# Patient Record
Sex: Male | Born: 1950 | Race: White | Hispanic: No | Smoking: Former smoker
Health system: Southern US, Community
[De-identification: ages and names within clinical notes are randomized; demographics above are authoritative.]

## PROBLEM LIST (undated history)

## (undated) DIAGNOSIS — N200 Calculus of kidney: Secondary | ICD-10-CM

## (undated) DIAGNOSIS — I10 Essential (primary) hypertension: Secondary | ICD-10-CM

## (undated) DIAGNOSIS — H269 Unspecified cataract: Secondary | ICD-10-CM

## (undated) DIAGNOSIS — E785 Hyperlipidemia, unspecified: Secondary | ICD-10-CM

## (undated) DIAGNOSIS — Z9889 Other specified postprocedural states: Secondary | ICD-10-CM

## (undated) DIAGNOSIS — IMO0002 Reserved for concepts with insufficient information to code with codable children: Secondary | ICD-10-CM

## (undated) DIAGNOSIS — I2510 Atherosclerotic heart disease of native coronary artery without angina pectoris: Secondary | ICD-10-CM

## (undated) HISTORY — PX: WISDOM TOOTH EXTRACTION: SHX21

## (undated) HISTORY — DX: Unspecified cataract: H26.9

## (undated) HISTORY — DX: Essential (primary) hypertension: I10

## (undated) HISTORY — DX: Atherosclerotic heart disease of native coronary artery without angina pectoris: I25.10

## (undated) HISTORY — PX: OTHER SURGICAL HISTORY: SHX169

## (undated) HISTORY — DX: Hyperlipidemia, unspecified: E78.5

## (undated) HISTORY — DX: Calculus of kidney: N20.0

## (undated) HISTORY — DX: Other specified postprocedural states: Z98.890

## (undated) HISTORY — DX: Reserved for concepts with insufficient information to code with codable children: IMO0002

## (undated) HISTORY — PX: EYE SURGERY: SHX253

---

## 1975-10-24 HISTORY — PX: VASECTOMY: SHX75

## 2000-10-23 DIAGNOSIS — Z9889 Other specified postprocedural states: Secondary | ICD-10-CM

## 2000-10-23 HISTORY — DX: Other specified postprocedural states: Z98.890

## 2000-10-31 ENCOUNTER — Ambulatory Visit (HOSPITAL_COMMUNITY): Admission: RE | Admit: 2000-10-31 | Discharge: 2000-10-31 | Payer: Self-pay | Admitting: *Deleted

## 2000-11-06 ENCOUNTER — Ambulatory Visit (HOSPITAL_COMMUNITY): Admission: RE | Admit: 2000-11-06 | Discharge: 2000-11-06 | Payer: Self-pay | Admitting: *Deleted

## 2001-09-04 ENCOUNTER — Ambulatory Visit (HOSPITAL_BASED_OUTPATIENT_CLINIC_OR_DEPARTMENT_OTHER): Admission: RE | Admit: 2001-09-04 | Discharge: 2001-09-04 | Payer: Self-pay | Admitting: Otolaryngology

## 2007-11-01 ENCOUNTER — Ambulatory Visit (HOSPITAL_COMMUNITY): Admission: RE | Admit: 2007-11-01 | Discharge: 2007-11-01 | Payer: Self-pay | Admitting: Neurosurgery

## 2007-11-24 HISTORY — PX: SPINE SURGERY: SHX786

## 2009-11-08 ENCOUNTER — Encounter: Payer: Self-pay | Admitting: Internal Medicine

## 2009-11-16 ENCOUNTER — Encounter (INDEPENDENT_AMBULATORY_CARE_PROVIDER_SITE_OTHER): Payer: Self-pay | Admitting: *Deleted

## 2009-12-08 ENCOUNTER — Encounter (INDEPENDENT_AMBULATORY_CARE_PROVIDER_SITE_OTHER): Payer: Self-pay | Admitting: *Deleted

## 2009-12-10 ENCOUNTER — Ambulatory Visit: Payer: Self-pay | Admitting: Internal Medicine

## 2009-12-22 ENCOUNTER — Telehealth (INDEPENDENT_AMBULATORY_CARE_PROVIDER_SITE_OTHER): Payer: Self-pay | Admitting: *Deleted

## 2009-12-24 ENCOUNTER — Ambulatory Visit: Payer: Self-pay | Admitting: Internal Medicine

## 2009-12-30 ENCOUNTER — Encounter: Payer: Self-pay | Admitting: Internal Medicine

## 2010-11-24 NOTE — Procedures (Signed)
Summary: Colonoscopy  Patient: Ernest [name] Note: All result statuses are Final unless otherwise noted.  Tests: (1) Colonoscopy (COL)   COL Colonoscopy           DONE (C)     [facility]     [address]     [place], [place]  [address]           COLONOSCOPY PROCEDURE REPORT           PATIENT:  [name], [name]  MR#:  [id number]     BIRTHDATE:  03/09/1951, 58 yrs. old  GENDER:  male           ENDOSCOPIST:  Iva Boop, MD, [facility]     Referred by:  Robert Bellow, M.D.           PROCEDURE DATE:  12/24/2009     PROCEDURE:  Colonoscopy with biopsy and snare polypectomy     ASA CLASS:  Class I     INDICATIONS:  Routine Risk Screening           MEDICATIONS:   Fentanyl 50 mcg IV, Versed 6 mg IV           DESCRIPTION OF PROCEDURE:   After the risks benefits and     alternatives of the procedure were thoroughly explained, informed     consent was obtained.  Digital rectal exam was performed and     revealed no abnormalities and normal prostate.   The LB CF-H180AL     [id number] endoscope was introduced through the anus and advanced to     the terminal ileum which was intubated for a short distance,     without limitations.  The quality of the prep was excellent, using     MoviPrep.  The instrument was then slowly withdrawn as the colon     was fully examined.     Insertion: 2:16 minutes Withdrawal; 11:14 minutes     <<PROCEDUREIMAGES>>           FINDINGS:  Three polyps were found in the right colon. They were     diminutive. Two in ascending and one in transverse colon. One     polyp was removed using cold biopsy forceps. Two polyps were     snared without cautery. Retrieval was successful. This was     otherwise a normal examination of the colon.   Retroflexed views     in the rectum revealed internal hemorrhoids.    The scope was then     withdrawn from the patient and the procedure completed.           COMPLICATIONS:  None           ENDOSCOPIC IMPRESSION:     1)  Three diminutive  polyps removed from the right colon     2) Internal hemorrhoids     3) Normal colonoscopy otherwise, excellent prep           ADDENDUM: Mild Sigmoid Diverticulosis           REPEAT EXAM:  In for Colonoscopy, pending biopsy results.           Iva Boop, MD, [facility]           CC:  Robert Bellow, MD     The Patient           n.     REVISED:  12/24/2009 11:34 AM     eSIGNED:   Iva Boop at  12/24/2009 11:34 AM           Berneta Sages, [id number]  Note: An exclamation mark (!) indicates a result that was not dispersed into the flowsheet. Document Creation Date: 12/24/2009 11:34 AM _______________________________________________________________________  (1) Order result status: Final Collection or observation date-time: 12/24/2009 11:11 Requested date-time:  Receipt date-time:  Reported date-time:  Referring Physician:   Ordering Physician: Stan Head [phone]) Specimen Source:  Source: Launa Grill Order Number: [id number] Lab site:   Appended Document: Colonoscopy     Procedures Next Due Date:    Colonoscopy: 12/2014

## 2010-11-24 NOTE — Miscellaneous (Signed)
Summary: [facility] Previsit/prep  Clinical Lists Changes  Medications: Added new medication of MOVIPREP 100 GM  SOLR (PEG-KCL-NACL-NASULF-NA ASC-C) As per prep instructions. - Signed Rx of MOVIPREP 100 GM  SOLR (PEG-KCL-NACL-NASULF-NA ASC-C) As per prep instructions.;  #1 x 0;  Signed;  Entered by: Wyona Almas RN;  Authorized by: Iva Boop MD, [facility];  Method used: [facility] to [facility] [phone]*, [address] [place], [place], [place]  [address], Ph: [phone], Fax: [phone] Observations: Added new observation of NKA: T (12/10/2009 8:09)    Prescriptions: MOVIPREP 100 GM  SOLR (PEG-KCL-NACL-NASULF-NA ASC-C) As per prep instructions.  #1 x 0   Entered by:   Wyona Almas RN   Authorized by:   Iva Boop MD, [facility]   Signed by:   Wyona Almas RN on 12/10/2009   Method used:   Electronically to        [facility] [phone]* (retail)       [address]       [place], [place]  [address]       Ph: [phone]       Fax: [phone]   RxID:   [id number]

## 2010-11-24 NOTE — Letter (Signed)
Summary: [name] Instructions  Ernest [name]  [address] [place], [place] [address]   Phone: [phone]  Fax: [phone]       Ernest [name]    12/18/1950    MRN: [id number]        Procedure Day Ernest [name]:  Ernest [name]  12/24/09     Arrival Time:  9:30AM     Procedure Time:  10:30AM     Location of Procedure:                    _X _   Endoscopy Center (4th Floor)                        PREPARATION FOR COLONOSCOPY WITH MOVIPREP   Starting 5 days prior to your procedure 12/19/09 do not eat nuts, seeds, popcorn, corn, beans, peas,  salads, or any raw vegetables.  Do not take any fiber supplements (e.g. Metamucil, Citrucel, and Benefiber).  THE DAY BEFORE YOUR PROCEDURE         DATE: 12/23/09  DAY: THURSDAY  1.  Drink clear liquids the entire day-NO SOLID FOOD  2.  Do not drink anything colored red or purple.  Avoid juices with pulp.  No orange juice.  3.  Drink at least 64 oz. (8 glasses) of fluid/clear liquids during the day to prevent dehydration and help the prep work efficiently.  CLEAR LIQUIDS INCLUDE: Water Jello Ice Popsicles Tea (sugar ok, no milk/cream) Powdered fruit flavored drinks Coffee (sugar ok, no milk/cream) Gatorade Juice: apple, white grape, white cranberry  Lemonade Clear bullion, consomm, broth Carbonated beverages (any kind) Strained chicken noodle soup Hard Candy                             4.  In the morning, mix first dose of MoviPrep solution:    Empty 1 Pouch A and 1 Pouch B into the disposable container    Add lukewarm drinking water to the top line of the container. Mix to dissolve    Refrigerate (mixed solution should be used within 24 hrs)  5.  Begin drinking the prep at 5:00 p.m. The MoviPrep container is divided by 4 marks.   Every 15 minutes drink the solution down to the next mark (approximately 8 oz) until the full liter is complete.   6.  Follow completed prep with 16 oz of clear liquid of your choice  (Nothing red or purple).  Continue to drink clear liquids until bedtime.  7.  Before going to bed, mix second dose of MoviPrep solution:    Empty 1 Pouch A and 1 Pouch B into the disposable container    Add lukewarm drinking water to the top line of the container. Mix to dissolve    Refrigerate  THE DAY OF YOUR PROCEDURE      DATE: 12/24/09  DAY: FRIDAY  Beginning at 5:30AM (5 hours before procedure):         1. Every 15 minutes, drink the solution down to the next mark (approx 8 oz) until the full liter is complete.  2. Follow completed prep with 16 oz. of clear liquid of your choice.    3. You may drink clear liquids until 8:30AM (2 HOURS BEFORE PROCEDURE).   MEDICATION INSTRUCTIONS  Unless otherwise instructed, you should take regular prescription medications with a small sip of water   as early as possible the morning  of your procedure.        OTHER INSTRUCTIONS  You will need a responsible adult at least 60 years of age to accompany you and drive you home.   This person must remain in the waiting room during your procedure.  Wear loose fitting clothing that is easily removed.  Leave jewelry and other valuables at home.  However, you may wish to bring a book to read or  an iPod/MP3 player to listen to music as you wait for your procedure to start.  Remove all body piercing jewelry and leave at home.  Total time from sign-in until discharge is approximately 2-3 hours.  You should go home directly after your procedure and rest.  You can resume normal activities the  day after your procedure.  The day of your procedure you should not:   Drive   Make legal decisions   Operate machinery   Drink alcohol   Return to work  You will receive specific instructions about eating, activities and medications before you leave.    The above instructions have been reviewed and explained to me by   Ernest Almas RN  December 10, 2009 8:36 AM     I fully understand  and can verbalize these instructions _____________________________ Date _________

## 2010-11-24 NOTE — Letter (Signed)
Summary: Previsit letter  [facility]  [address] [place], [place] [address]   Phone: [phone]  Fax: [phone]       11/16/2009 MRN: [id number]  Ernest [name] [address] [place], [place]  [address]  Dear Mr. Flinn,  Welcome to the Gastroenterology Division at [facility].    You are scheduled to see a nurse for your pre-procedure visit on 12-10-09 at 8:30a.m. on the 3rd floor at [facility], [address]  We ask that you try to arrive at our office 15 minutes prior to your appointment time to allow for check-in.  Your nurse visit will consist of discussing your medical and surgical history, your immediate family medical history, and your medications.    Please bring a complete list of all your medications or, if you prefer, bring the medication bottles and we will list them.  We will need to be aware of both prescribed and over the counter drugs.  We will need to know exact dosage information as well.  If you are on blood thinners (Coumadin, Plavix, Aggrenox, Ticlid, etc.) please call our office today/prior to your appointment, as we need to consult with your physician about holding your medication.   Please be prepared to read and sign documents such as consent forms, a financial agreement, and acknowledgement forms.  If necessary, and with your consent, a friend or relative is welcome to sit-in on the nurse visit with you.  Please bring your insurance card so that we may make a copy of it.  If your insurance requires a referral to see a specialist, please bring your referral form from your primary care physician.  No co-pay is required for this nurse visit.     If you cannot keep your appointment, please call [phone] to cancel or reschedule prior to your appointment date.  This allows [place] the opportunity to schedule an appointment for another patient in need of care.    Thank you for choosing [facility] for your medical needs.   We appreciate the opportunity to care for you.  Please visit [place] at our website  to learn more about our practice.                     Sincerely.                                                                                                                   The Gastroenterology Division

## 2010-11-24 NOTE — Letter (Signed)
Summary: [facility]  [facility]   Imported By: Sherian Rein 01/18/2010 09:08:23  _____________________________________________________________________  External Attachment:    Type:   Image     Comment:   External Document

## 2010-11-24 NOTE — Progress Notes (Signed)
Summary: prep ?  Phone Note Call from Patient Call back at Home Phone [phone]   Caller: Patient Call For: Dr. Leone Payor Reason for Call: Talk to Nurse Summary of Call: pt has COL on Friday... he ate a salad, raw vegetables, and nuts Monday and a salad Tuesday (yesterday) Initial call taken by: Vallarie Mare,  December 22, 2009 1:47 PM  Follow-up for Phone Call        Told pt he did not need to rescedule procedure.  Reviewed food restrictions with pat and told him to stay within those guidelines until he had his procedure. Follow-up by: Ezra Sites RN,  December 22, 2009 1:54 PM

## 2010-11-24 NOTE — Letter (Signed)
Summary: Patient Notice- Polyp Results  [facility]  [address] [place], [place] [address]   Phone: [phone]  Fax: [phone]        December 30, 2009 MRN: [id number]    Ernest [name] [address] [place], [place]  [address]    Dear Mr. Sforza,  The polyps removed from your colon were adenomatous. This means that they were pre-cancerous or that  they had the potential to change into cancer over time.  I recommend that you have a repeat colonoscopy in 5 years to determine if you have developed any new polyps over time. If you develop any new rectal bleeding, abdominal pain or significant bowel habit changes, please contact us before then.  Please call us if you are having persistent problems or have questions about your condition that have not been fully answered at this time.   Sincerely,  Iva Boop MD, [facility]  This letter has been electronically signed by your physician.  Appended Document: Patient Notice- Polyp Results Letter mailed 3.10.11

## 2011-03-07 NOTE — Op Note (Signed)
Ernest [name], Ernest [name]              ACCOUNT NO.:  [id number]   MEDICAL RECORD NO.:  [id number]          PATIENT TYPE:  AMB   LOCATION:  SDS                          FACILITY:  [facility]   PHYSICIAN:  Coletta Memos, M.D.     DATE OF BIRTH:  1951-03-11   DATE OF PROCEDURE:  11/01/2007  DATE OF DISCHARGE:                               OPERATIVE REPORT   PREOPERATIVE DIAGNOSES:  1. Displaced disk right L5-S1.  2. Right S1 radiculopathy.   POSTOPERATIVE DIAGNOSES:  1. Displaced disk right L5-S1.  2. Right S1 radiculopathy.   PROCEDURE:  Right L5-S1 diskectomy with microdissection.   COMPLICATIONS:  None.   SURGEON:  Coletta Memos, M.D.   ASSISTANT:  Hewitt Shorts, M.D.   INDICATIONS FOR PROCEDURE:  Kohlton Gilpatrick presented with severe pain in  his right lower extremity.  MRI showed a very large herniated disk at L5-  S1 eccentric to the right side.  I offered operative decompression and  he has agreed.   OPERATIVE NOTE:  Mr. Gershman was brought to the operating room intubated  and placed under a general anesthetic without difficulty.  He was rolled  prone onto a Wilson frame and all pressure points were properly padded.  His back was prepped and then he was draped in a sterile fashion.  I  infiltrated 10 mL 0.5% lidocaine with 1:200,000 strength epinephrine  into the lumbar region.  I opened the skin with a #10 blade and took  this down to the thoracolumbar fascia sharply.  I then exposed the  lamina of L5 and S1 using a #10 blade and Cobb curettes.  After  reflecting the paraspinous musculature in a subperiosteal fashion I  placed a double ended ganglion knife inferior to the superior lamina  exposing my wound.  X-ray showed this was at the L5-S1 disk space  interlaminar space.   I then opened the ligamentum flavum using a #10 blade and a hook.  I  removed it with a Kerrison punch and expose the thecal sac and S1 nerve  root on the right side.  I retracted that medially and  encountered what  was a large disk herniation.  I used a 15 blade to cut a window like  opening, rectangular opening into the disk space through the annulus at  L5-S1.  With microdissection and Dr. Earl Gala assistance we then  removed the disk in a piecemeal fashion.  It was quite degenerated and a  good portion of it did come out.  I then irrigated the wound.  I felt  that there  was adequate decompression of the S1 nerve root and the thecal sac at  that level.  I then irrigated the wound.  I then closed the wound in a  layered fashion using Vicryl sutures.  Dermabond was used for a sterile  dressing.  Mr. Kadrmas tolerated the procedure well and was moving all  extremities postoperatively.           ______________________________  Coletta Memos, M.D.     KC/MEDQ  D:  11/01/2007  T:  11/01/2007  Job:  [id number]

## 2011-03-10 NOTE — Procedures (Signed)
[facility]  Patient:    Ernest [name], Ernest [name]                     MRN: [id number] Proc. Date: 11/06/00 Adm. Date:  [id number] Attending:  Sabino Gasser CC:         Dr. Robert Bellow, [facility]                           Procedure Report  PROCEDURE:  Colonoscopy.  INDICATIONS:  Colon cancer screening.  ANESTHESIA:  Demerol 75 mg, Versed 8 mg.  DESCRIPTION OF PROCEDURE:  With patient mildly sedated in the left lateral decubitus position, the Olympus videoscopic colonoscope was inserted into the rectum after normal rectal examination was performed and passed under direct vision to the cecum.  Cecum identified by the ileocecal valve and appendiceal orifice, both of which were photographed.  From this point, the colonoscope was slowly withdrawn, taking circumferential views of the entire colonic mucosa, stopping only in the rectum, which appeared normal on direct and retroflex view.  The endoscope was straightened and withdrawn.  The patients vital signs and pulse oximetry remained stable.  The patient tolerated the procedure well without apparent complications.  FINDINGS:  Unremarkable colonoscopic examination.  PLAN:  Repeat examination possibly in 10 years. DD:  11/06/00 TD:  11/06/00 Job: [id number]

## 2011-03-10 NOTE — Cardiovascular Report (Signed)
[place]  Patient:    Ernest [name], Ernest [name]                    MRN: [id number] Proc. Date: 10/31/00 Attending:  Daisey Must, M.D. [facility] CC:         Robert Bellow, M.D.             Cardiac Catheterization Lab                        Cardiac Catheterization  PROCEDURE PERFORMED:  Left heart catheterization with coronary angiography and left ventriculography.  INDICATIONS:  Mr. Klabunde is a 60 year old male with multiple cardiovascular risk factors.  He has been having atypical chest pain.  A stress Cardiolite done in the office was notable for borderline mild ischemia in the inferior wall.  We therefore opted to proceed with cardiac catheterization.  PROCEDURAL NOTE:  A 6-French sheath was placed in the right femoral artery. Standard Judkins 6-French catheters were utilized.  Contrast was Omnipaque. There were no complications.  RESULTS:  Hemodynamics:  Left ventricular pressure 120/16.  Aortic pressure 120/75. There is no aortic valve gradient.  Left ventriculogram:  Wall motion is normal.  Ejection fraction calculated at 63%.  No mitral regurgitation.  Coronary arteriography (left dominant): 1. The left main is normal. 2. The left anterior descending artery has a 20% stenosis at its origin and a    minor luminal irregularity in the mid vessel.  The LAD gives rise to 2    small diagonal branches. 3. The left circumflex is a large dominant vessel.  There is a 50% stenosis in    the ostium of the circumflex and a 50% stenosis in the distal circumflex    at the origin of the posterior descending artery.  The circumflex gives    rise to a large ramus intermedius which has a 30% stenosis at its origin.    The circumflex then gives rise to a small OM-1, small OM-2, a small to    normal-sized posterolateral branch, and a normal-sized posterior descending    artery. 4. The right coronary artery is a small, nondominant vessel and free of  angiographic disease.  IMPRESSIONS: 1. Normal left ventricular systolic function. 2. Moderate, but nonobstructive, disease involving the left circumflex as    described.  PLAN:  Medical therapy.DD:  10/31/00 TD:  10/31/00 Job: [id number]

## 2011-07-13 LAB — CBC
HCT: 41.8
Hemoglobin: 14.5
RDW: 12.9

## 2011-10-04 ENCOUNTER — Ambulatory Visit (INDEPENDENT_AMBULATORY_CARE_PROVIDER_SITE_OTHER): Payer: BC Managed Care – PPO

## 2011-10-04 DIAGNOSIS — J111 Influenza due to unidentified influenza virus with other respiratory manifestations: Secondary | ICD-10-CM

## 2012-03-12 ENCOUNTER — Ambulatory Visit (INDEPENDENT_AMBULATORY_CARE_PROVIDER_SITE_OTHER): Payer: Managed Care, Other (non HMO) | Admitting: Family Medicine

## 2012-03-12 DIAGNOSIS — L509 Urticaria, unspecified: Secondary | ICD-10-CM

## 2012-03-12 LAB — POCT CBC
Granulocyte percent: 59.6 %G (ref 37–80)
HCT, POC: 42.2 % — AB (ref 43.5–53.7)
Hemoglobin: 13.9 g/dL — AB (ref 14.1–18.1)
Lymph, poc: 1.7 (ref 0.6–3.4)
MCH, POC: 32.8 pg — AB (ref 27–31.2)
MCHC: 32.9 g/dL (ref 31.8–35.4)
MCV: 99.6 fL — AB (ref 80–97)
MID (cbc): 0.4 (ref 0–0.9)
MPV: 9.9 fL (ref 0–99.8)
POC Granulocyte: 3.1 (ref 2–6.9)
POC LYMPH PERCENT: 33.6 %L (ref 10–50)
POC MID %: 6.8 %M (ref 0–12)
Platelet Count, POC: 251 10*3/uL (ref 142–424)
RBC: 4.24 M/uL — AB (ref 4.69–6.13)
RDW, POC: 13.2 %
WBC: 5.2 10*3/uL (ref 4.6–10.2)

## 2012-03-12 LAB — COMPREHENSIVE METABOLIC PANEL
ALT: 20 U/L (ref 0–53)
AST: 21 U/L (ref 0–37)
Albumin: 4.6 g/dL (ref 3.5–5.2)
Alkaline Phosphatase: 58 U/L (ref 39–117)
BUN: 22 mg/dL (ref 6–23)
CO2: 26 mEq/L (ref 19–32)
Calcium: 9.3 mg/dL (ref 8.4–10.5)
Chloride: 105 mEq/L (ref 96–112)
Creat: 0.91 mg/dL (ref 0.50–1.35)
Glucose, Bld: 99 mg/dL (ref 70–99)
Potassium: 4.4 mEq/L (ref 3.5–5.3)
Sodium: 140 mEq/L (ref 135–145)
Total Bilirubin: 0.6 mg/dL (ref 0.3–1.2)
Total Protein: 7.5 g/dL (ref 6.0–8.3)

## 2012-03-12 LAB — POCT UA - MICROSCOPIC ONLY
Casts, Ur, LPF, POC: NEGATIVE
Crystals, Ur, HPF, POC: NEGATIVE
Yeast, UA: NEGATIVE

## 2012-03-12 LAB — POCT URINALYSIS DIPSTICK
Bilirubin, UA: NEGATIVE
Glucose, UA: NEGATIVE
Ketones, UA: NEGATIVE
Leukocytes, UA: NEGATIVE
Nitrite, UA: NEGATIVE
Protein, UA: NEGATIVE
Spec Grav, UA: 1.02
Urobilinogen, UA: 0.2
pH, UA: 7.5

## 2012-03-12 LAB — POCT SEDIMENTATION RATE: POCT SED RATE: 25 mm/hr — AB (ref 0–22)

## 2012-03-12 MED ORDER — METHYLPREDNISOLONE ACETATE 80 MG/ML IJ SUSP
120.0000 mg | Freq: Once | INTRAMUSCULAR | Status: AC
  Administered 2012-03-12: 120 mg via INTRAMUSCULAR

## 2012-03-12 NOTE — Progress Notes (Signed)
This is a 61 year old gentleman who comes in with 5 days of intermittent urticaria. He's tried Benadryl which is offered minimal relief. He's never had urticaria before, and he's had no recent colds, change of medication, new foods, or other different contacts.  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  Objective: No acute distress, patient cooperative and friendly  Skin: Several fading areas of urticaria are noted.  HEENT: Negative  Chest: Negative for wheezing or rales  Heart: Regular, no murmur gallop or.  Assessment: Urticaria, unknown cause  Plan: Survey of metabolic parameters to see if there is an underlying cause  Depo-Medrol 120 I am

## 2012-03-12 NOTE — Patient Instructions (Signed)
Hives Hives (urticaria) are itchy, red, swollen patches on the skin. They may change size, shape, and location quickly and repeatedly. Hives that occur deeper in the skin can cause swelling of the hands, feet, and face. Hives may be an allergic reaction to something you or your child ate, touched, or put on the skin. Hives can also be a reaction to cold, heat, viral infections, medication, insect bites, or emotional stress. Often the cause is hard to find. Hives can come and go for several days to several weeks. Hives are not contagious. HOME CARE INSTRUCTIONS   If the cause of the hives is known, avoid exposure to that source.   To relieve itching and rash:   Apply cold compresses to the skin or take cool water baths. Do not take or give your child hot baths or showers because the warmth will make the itching worse.   The best medicine for hives is an antihistamine. An antihistamine will not cure hives, but it will reduce their severity. You can use an antihistamine available over the counter. This medicine may make your child sleepy. Teenagers should not drive while using this medicine.   Take or give an antihistamine every 6 hours until the hives are completely gone for 24 hours or as directed.   Your child may have other medications prescribed for itching. Give these as directed by your child's caregiver.   You or your child should wear loose fitting clothing, including undergarments. Skin irritations may make hives worse.   Follow-up as directed by your caregiver.  SEEK MEDICAL CARE IF:   You or your child still have considerable itching after taking the medication (prescribed or purchased over the counter).   Joint swelling or pain occurs.  SEEK IMMEDIATE MEDICAL CARE IF:   You have a fever.   Swollen lips or tongue are noticed.   There is difficulty with breathing, swallowing, or tightness in the throat or chest.   Abdominal pain develops.   Your child starts acting very  sick.  These may be the first signs of a life-threatening allergic reaction. THIS IS AN EMERGENCY. Call 911 for medical help. MAKE SURE YOU:   Understand these instructions.   Will watch your condition.   Will get help right away if you are not doing well or get worse.  Document Released: 10/09/2005 Document Revised: 09/28/2011 Document Reviewed: 05/29/2008 Exit[employer] Patient Information 2012 [employer]. 

## 2012-10-12 ENCOUNTER — Ambulatory Visit: Payer: BC Managed Care – PPO | Admitting: Family Medicine

## 2012-10-12 VITALS — BP 158/88 | HR 82 | Temp 100.5°F | Resp 16 | Ht 72.0 in | Wt 177.0 lb

## 2012-10-12 DIAGNOSIS — R5381 Other malaise: Secondary | ICD-10-CM

## 2012-10-12 DIAGNOSIS — R509 Fever, unspecified: Secondary | ICD-10-CM

## 2012-10-12 DIAGNOSIS — R5383 Other fatigue: Secondary | ICD-10-CM

## 2012-10-12 DIAGNOSIS — J111 Influenza due to unidentified influenza virus with other respiratory manifestations: Secondary | ICD-10-CM

## 2012-10-12 MED ORDER — OSELTAMIVIR PHOSPHATE 75 MG PO CAPS: 75.0000 mg | ORAL_CAPSULE | Freq: Two times a day (BID) | ORAL | Status: DC

## 2012-10-12 MED ORDER — IBUPROFEN 200 MG PO TABS: 600.0000 mg | ORAL_TABLET | Freq: Once | ORAL | Status: DC

## 2012-10-12 MED ORDER — HYDROCODONE-HOMATROPINE 5-1.5 MG/5ML PO SYRP: 5.0000 mL | ORAL_SOLUTION | Freq: Three times a day (TID) | ORAL | Status: DC | PRN

## 2012-10-12 NOTE — Patient Instructions (Addendum)
You have the flu.  Rest, drink plenty of fluids.  Let us know if you are not better in the next few days- Sooner if worse.

## 2012-10-12 NOTE — Progress Notes (Signed)
[facility] [address] [place] [address] [phone]  Date:  10/12/2012   Name:  Ernest [name]   DOB:  [birth year]   MRN:  [id number]  PCP:  No primary provider on file.    Chief Complaint: Cough, Fever, Nasal Congestion and Headache   History of Present Illness:  Ernest [name] is a 61 y.o. very pleasant male patient who presents with the following:  He felt tired Thursday am.  Friday he felt achy, developed a cough and fevers. He took his temp- it was 100.5.  He left work and got some OTC medications.  He felt a little better this am, but then the fever returned.  He has a cough, aches, HA, chills, fatigue.   He did have a mild ST- now resolved.   No GI symptoms, no urinary symptoms  No sick contacts but he is around a lot of people at work.   He is generally healthy.   He thinks he may have the flu  There is no problem list on file for this patient.   History reviewed. No pertinent past medical history.  History reviewed. No pertinent past surgical history.  History  Substance Use Topics  . Smoking status: Never Smoker   . Smokeless tobacco: Not on file  . Alcohol Use: Yes     Comment: social    No family history on file.  No Known Allergies  Medication list has been reviewed and updated.  No current outpatient prescriptions on file prior to visit.    Review of Systems:  As per HPI- otherwise negative.   Physical Examination: Filed Vitals:   10/12/12 1702  BP: 158/88  Pulse: 82  Temp: 100.5 F (38.1 C)  Resp: 16   Filed Vitals:   10/12/12 1702  Height: 6' (1.829 m)  Weight: 177 lb (80.287 kg)   Body mass index is 24.01 kg/(m^2). Ideal Body Weight: Weight in (lb) to have BMI = 25: 183.9   GEN: WDWN, NAD, Non-toxic, A & O x 3- appears to have the flu HEENT: Atraumatic, Normocephalic. Neck supple. No masses, No LAD. Bilateral TM wnl, oropharynx normal.  PEERL,EOMI.   Ears and Nose: No external deformity. CV:  RRR, No M/G/R. No JVD. No thrill. No extra heart sounds. PULM: CTA B, no wheezes, crackles, rhonchi. No retractions. No resp. distress. No accessory muscle use. ABD: S, NT, ND EXTR: No c/c/e NEURO Normal gait.  PSYCH: Normally interactive. Conversant. Not depressed or anxious appearing.  Calm demeanor.   Results for orders placed in visit on 10/12/12  POCT INFLUENZA A/B      Component Value Range   Influenza A, POC Positive     Influenza B, POC Negative      Assessment and Plan: 1. Fever  POCT Influenza A/B, ibuprofen (ADVIL,MOTRIN) tablet 600 mg  2. Malaise    3. Flu  HYDROcodone-homatropine (HYCODAN) 5-1.5 MG/5ML syrup, oseltamivir (TAMIFLU) 75 MG capsule   See patient instructions for more details.    Meds ordered this encounter  Medications  . ibuprofen (ADVIL,MOTRIN) tablet 600 mg    Sig:   . HYDROcodone-homatropine (HYCODAN) 5-1.5 MG/5ML syrup    Sig: Take 5 mLs by mouth every 8 (eight) hours as needed for cough.    Dispense:  90 mL    Refill:  0  . oseltamivir (TAMIFLU) 75 MG capsule    Sig: Take 1 capsule (75 mg total) by mouth 2 (two) times daily.  Dispense:  10 capsule    Refill:  0     Juanitta Earnhardt, MD

## 2012-12-07 ENCOUNTER — Other Ambulatory Visit: Payer: Self-pay

## 2013-01-02 ENCOUNTER — Encounter: Payer: Self-pay | Admitting: Family Medicine

## 2013-01-02 ENCOUNTER — Ambulatory Visit (INDEPENDENT_AMBULATORY_CARE_PROVIDER_SITE_OTHER): Payer: BC Managed Care – PPO | Admitting: Family Medicine

## 2013-01-02 VITALS — BP 120/68 | HR 58 | Temp 98.2°F | Resp 16 | Ht 72.5 in | Wt 176.0 lb

## 2013-01-02 DIAGNOSIS — Z23 Encounter for immunization: Secondary | ICD-10-CM

## 2013-01-02 DIAGNOSIS — Z8249 Family history of ischemic heart disease and other diseases of the circulatory system: Secondary | ICD-10-CM | POA: Insufficient documentation

## 2013-01-02 DIAGNOSIS — Z0000 Encounter for general adult medical examination without abnormal findings: Secondary | ICD-10-CM

## 2013-01-02 LAB — LIPID PANEL
Cholesterol: 241 mg/dL — ABNORMAL HIGH (ref 0–200)
HDL: 61 mg/dL (ref >39)
Total CHOL/HDL Ratio: 4 Ratio
VLDL: 29 mg/dL (ref 0–40)

## 2013-01-02 LAB — POCT URINALYSIS DIPSTICK
Bilirubin, UA: NEGATIVE
Ketones, UA: NEGATIVE
Spec Grav, UA: 1.02
pH, UA: 7
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
## 2013-01-02 NOTE — Patient Instructions (Signed)
Keeping you healthy  Get these tests  Blood pressure- Have your blood pressure checked once a year by your healthcare provider.  Normal blood pressure is 120/80  Weight- Have your body mass index (BMI) calculated to screen for obesity.  BMI is a measure of body fat based on height and weight. You can also calculate your own BMI at [email or web address].  Cholesterol- Have your cholesterol checked every year.  Diabetes- Have your blood sugar checked regularly if you have high blood pressure, high cholesterol, have a family history of diabetes or if you are overweight.  Screening for Colon Cancer- Colonoscopy starting at age 58.  Screening may begin sooner depending on your family history and other health conditions. Follow up colonoscopy as directed by your Gastroenterologist.  Screening for Prostate Cancer- Both blood work (PSA) and a rectal exam help screen for Prostate Cancer.  Screening begins at age 75 with African-American men and at age 10 with Caucasian men.  Screening may begin sooner depending on your family history.  Take these medicines  Aspirin- One aspirin daily can help prevent Heart disease and Stroke.  Flu shot- Every fall.  Tetanus- Every 10 years. You received Tdap today; this is a one-time vaccine. Next Tetanus is due in 2024.  Zostavax- Once after the age of 28 to prevent Shingles. I have given you a prescription for this vaccine; you can get it administered at [facility] or [address].  Pneumonia shot- Once after the age of 76; if you are younger than 48, ask your healthcare provider if you need a Pneumonia shot.  Take these steps  Don't smoke- If you do smoke, talk to your doctor about quitting.  For tips on how to quit, go to www.smokefree.gov or call 1-800-QUIT-NOW.  Be physically active- Exercise 5 days a week for at least 30 minutes.  If you are not already physically active start slow and gradually work up to 30 minutes of moderate physical  activity.  Examples of moderate activity include walking briskly, mowing the yard, dancing, swimming, bicycling, etc.  Eat a healthy diet- Eat a variety of healthy food such as fruits, vegetables, low fat milk, low fat cheese, yogurt, lean meant, poultry, fish, beans, tofu, etc. For more information go to www.thenutritionsource.org  Drink alcohol in moderation- Limit alcohol intake to less than two drinks a day. Never drink and drive.  Dentist- Brush and floss twice daily; visit your dentist twice a year.  Depression- Your emotional health is as important as your physical health. If you're feeling down, or losing interest in things you would normally enjoy please talk to your healthcare provider.  Eye exam- Visit your eye doctor every year.  Safe sex- If you may be exposed to a sexually transmitted infection, use a condom.  Seat belts- Seat belts can save your life; always wear one.  Smoke/Carbon Monoxide detectors- These detectors need to be installed on the appropriate level of your home.  Replace batteries at least once a year.  Skin cancer- When out in the sun, cover up and use sunscreen 15 SPF or higher.  Violence- If anyone is threatening you, please tell your healthcare provider.  Living Will/ Health care power of attorney- Speak with your healthcare provider and family.

## 2013-01-05 NOTE — Progress Notes (Signed)
Quick Note:  Please contact pt and advise that the following labs are abnormal...  Total and LDL ("bad") cholesterol are elevated. Good news- HDL ("good") cholesterol is high also. Your risk of heart disease is about average based on these numbers. Work on better nutrition and staying active (this keeps the HDL high). Try over-the-counter Fish Oil supplement 1200 mg daily; this will lower your total and LDL cholesterol numbers.   Copy To pt. ______

## 2013-01-09 ENCOUNTER — Encounter: Payer: Self-pay | Admitting: *Deleted

## 2013-08-28 ENCOUNTER — Other Ambulatory Visit: Payer: Self-pay

## 2014-08-07 ENCOUNTER — Other Ambulatory Visit: Payer: Self-pay

## 2015-01-11 ENCOUNTER — Encounter: Payer: Self-pay | Admitting: Internal Medicine

## 2015-06-01 ENCOUNTER — Encounter: Payer: Self-pay | Admitting: Internal Medicine

## 2015-07-07 ENCOUNTER — Ambulatory Visit (INDEPENDENT_AMBULATORY_CARE_PROVIDER_SITE_OTHER): Payer: 59 | Admitting: Family Medicine

## 2015-07-07 ENCOUNTER — Ambulatory Visit (INDEPENDENT_AMBULATORY_CARE_PROVIDER_SITE_OTHER): Payer: 59

## 2015-07-07 VITALS — BP 169/75 | HR 78 | Temp 97.9°F | Resp 18

## 2015-07-07 DIAGNOSIS — M79645 Pain in left finger(s): Secondary | ICD-10-CM

## 2015-07-07 DIAGNOSIS — S61319A Laceration without foreign body of unspecified finger with damage to nail, initial encounter: Secondary | ICD-10-CM

## 2015-07-07 DIAGNOSIS — S61219A Laceration without foreign body of unspecified finger without damage to nail, initial encounter: Secondary | ICD-10-CM

## 2015-07-07 DIAGNOSIS — S61213A Laceration without foreign body of left middle finger without damage to nail, initial encounter: Secondary | ICD-10-CM | POA: Diagnosis not present

## 2015-07-07 MED ORDER — CEPHALEXIN 500 MG PO CAPS: 500.0000 mg | ORAL_CAPSULE | Freq: Three times a day (TID) | ORAL | Status: DC

## 2015-07-07 MED ORDER — HYDROCODONE-ACETAMINOPHEN 5-325 MG PO TABS: 1.0000 | ORAL_TABLET | Freq: Four times a day (QID) | ORAL | Status: DC | PRN

## 2015-07-07 NOTE — Progress Notes (Signed)
Chief Complaint:  Chief Complaint  Patient presents with  . Finger Injury    Middle finger cut on table saw about 45 mins ago    HPI: Ernest [name] is a 64 y.o. male who reports to [facility] today complaining of left middle finger injured by saw blade while cutting some material with table saw. He is utd on tetanus, he has an area of the middle nail cut off, he is having lots of pain. Not on any blood thinners. No prior injuries. Right hand dominant  History reviewed. No pertinent past medical history. Past Surgical History  Procedure Laterality Date  . Spine surgery    . Kidney stone removal     Social History   Social History  . Marital Status: Married    Spouse Name: N/A  . Number of Children: N/A  . Years of Education: N/A   Occupational History  . Site Supervisor    Social History Main Topics  . Smoking status: Former Research scientist (life sciences)  . Smokeless tobacco: None  . Alcohol Use: Yes     Comment: social  . Drug Use: No  . Sexual Activity:    Partners: Female   Other Topics Concern  . None   Social History Narrative   Married. Education:High School/Other.   Family History  Problem Relation Age of Onset  . Heart disease Father     heart attack  . COPD Daughter   . Diabetes Maternal Grandmother    No Known Allergies Prior to Admission medications   Not on File     ROS: The patient denies fevers, chills, night sweats, unintentional weight loss, chest pain, palpitations, wheezing, dyspnea on exertion, nausea, vomiting, abdominal pain, dysuria, hematuria, melena, numbness, weakness, or tingling.   All other systems have been reviewed and were otherwise negative with the exception of those mentioned in the HPI and as above.    PHYSICAL EXAM: Filed Vitals:   07/07/15 1306  BP: 169/75  Pulse: 78  Temp: 97.9 F (36.6 C)  Resp: 18   There is no weight on file to calculate BMI.   General: Alert, mild acute distress HEENT:  Normocephalic, atraumatic,  oropharynx patent. EOMI, PERRLA Cardiovascular:  Regular rate and rhythm, no rubs murmurs or gallops.  No Carotid bruits, radial pulse intact. No pedal edema.  Respiratory: Clear to auscultation bilaterally.  No wheezes, rales, or rhonchi.  No cyanosis, no use of accessory musculature Abdominal: No organomegaly, abdomen is soft and non-tender, positive bowel sounds. No masses. Skin:See below Neurologic: Facial musculature symmetric. Psychiatric: Patient acts appropriately throughout our interaction. Musculoskeletal: Gait intact. No edema, tenderness Left middle finger, nailbed laceration, 3/4 off, neurovascularly intact   LABS: Results for orders placed or performed in visit on 01/02/13  Lipid panel  Result Value Ref Range   Cholesterol 241 (H) 0 - 200 mg/dL   Triglycerides 145 <150 mg/dL   HDL 61 >39 mg/dL   Total CHOL/HDL Ratio 4.0 Ratio   VLDL 29 0 - 40 mg/dL   LDL Cholesterol 151 (H) 0 - 99 mg/dL  POCT urinalysis dipstick  Result Value Ref Range   Color, UA yellow    Clarity, UA clear    Glucose, UA neg    Bilirubin, UA neg    Ketones, UA neg    Spec Grav, UA 1.020    Blood, UA trace    pH, UA 7.0    Protein, UA trace    Urobilinogen, UA 0.2  Nitrite, UA neg    Leukocytes, UA Negative   IFOBT POC (occult bld, rslt in office)  Result Value Ref Range   IFOBT Positive      EKG/XRAY:   Primary read interpreted by Dr. Marin Comment at [facility]. Neg for fx or dislocation   ASSESSMENT/PLAN: Encounter Diagnoses  Name Primary?  . Finger laceration, initial encounter Yes  . Finger pain, left    Wrapped  it xeroform and coban, no fratures Rx norco Referred to hand center appt tomorrow,   Gross sideeffects, risk and benefits, and alternatives of medications d/w patient. Patient is aware that all medications have potential sideeffects and we are unable to predict every sideeffect or drug-drug interaction that may occur.  Thao Le DO  07/07/2015 1:49 PM

## 2015-10-26 ENCOUNTER — Encounter: Payer: Self-pay | Admitting: Internal Medicine

## 2016-03-23 ENCOUNTER — Ambulatory Visit (INDEPENDENT_AMBULATORY_CARE_PROVIDER_SITE_OTHER): Payer: Commercial Managed Care - HMO | Admitting: Family Medicine

## 2016-03-23 ENCOUNTER — Encounter: Payer: Self-pay | Admitting: Family Medicine

## 2016-03-23 VITALS — BP 138/70 | HR 57 | Temp 98.1°F | Resp 16 | Ht 73.0 in | Wt 174.4 lb

## 2016-03-23 DIAGNOSIS — Z130 Encounter for screening for diseases of the blood and blood-forming organs and certain disorders involving the immune mechanism: Secondary | ICD-10-CM

## 2016-03-23 DIAGNOSIS — Z8249 Family history of ischemic heart disease and other diseases of the circulatory system: Secondary | ICD-10-CM

## 2016-03-23 DIAGNOSIS — Z1389 Encounter for screening for other disorder: Secondary | ICD-10-CM

## 2016-03-23 DIAGNOSIS — Z1329 Encounter for screening for other suspected endocrine disorder: Secondary | ICD-10-CM

## 2016-03-23 DIAGNOSIS — Z113 Encounter for screening for infections with a predominantly sexual mode of transmission: Secondary | ICD-10-CM | POA: Diagnosis not present

## 2016-03-23 DIAGNOSIS — IMO0001 Reserved for inherently not codable concepts without codable children: Secondary | ICD-10-CM

## 2016-03-23 DIAGNOSIS — Z1211 Encounter for screening for malignant neoplasm of colon: Secondary | ICD-10-CM | POA: Diagnosis not present

## 2016-03-23 DIAGNOSIS — Z0000 Encounter for general adult medical examination without abnormal findings: Secondary | ICD-10-CM | POA: Diagnosis not present

## 2016-03-23 DIAGNOSIS — M19041 Primary osteoarthritis, right hand: Secondary | ICD-10-CM

## 2016-03-23 DIAGNOSIS — Z23 Encounter for immunization: Secondary | ICD-10-CM

## 2016-03-23 DIAGNOSIS — E785 Hyperlipidemia, unspecified: Secondary | ICD-10-CM | POA: Diagnosis not present

## 2016-03-23 DIAGNOSIS — Z136 Encounter for screening for cardiovascular disorders: Secondary | ICD-10-CM

## 2016-03-23 DIAGNOSIS — Z1212 Encounter for screening for malignant neoplasm of rectum: Secondary | ICD-10-CM | POA: Diagnosis not present

## 2016-03-23 DIAGNOSIS — H6121 Impacted cerumen, right ear: Secondary | ICD-10-CM

## 2016-03-23 DIAGNOSIS — Z1383 Encounter for screening for respiratory disorder NEC: Secondary | ICD-10-CM | POA: Diagnosis not present

## 2016-03-23 DIAGNOSIS — R030 Elevated blood-pressure reading, without diagnosis of hypertension: Secondary | ICD-10-CM

## 2016-03-23 DIAGNOSIS — Z125 Encounter for screening for malignant neoplasm of prostate: Secondary | ICD-10-CM | POA: Diagnosis not present

## 2016-03-23 LAB — COMPREHENSIVE METABOLIC PANEL
ALBUMIN: 4.5 g/dL (ref 3.6–5.1)
ALK PHOS: 55 U/L (ref 40–115)
ALT: 16 U/L (ref 9–46)
AST: 21 U/L (ref 10–35)
BUN: 17 mg/dL (ref 7–25)
CO2: 23 mmol/L (ref 20–31)
CREATININE: 0.87 mg/dL (ref 0.70–1.25)
Calcium: 9.2 mg/dL (ref 8.6–10.3)
Chloride: 103 mmol/L (ref 98–110)
Glucose, Bld: 91 mg/dL (ref 65–99)
POTASSIUM: 4.5 mmol/L (ref 3.5–5.3)
Sodium: 137 mmol/L (ref 135–146)
TOTAL PROTEIN: 7.5 g/dL (ref 6.1–8.1)
Total Bilirubin: 0.8 mg/dL (ref 0.2–1.2)

## 2016-03-23 LAB — POCT URINALYSIS DIP (MANUAL ENTRY)
GLUCOSE UA: NEGATIVE
Ketones, POC UA: NEGATIVE
LEUKOCYTES UA: NEGATIVE
NITRITE UA: NEGATIVE
Spec Grav, UA: 1.025
UROBILINOGEN UA: 0.2
pH, UA: 5.5

## 2016-03-23 LAB — LIPID PANEL
CHOL/HDL RATIO: 3.5 ratio (ref <=5.0)
CHOLESTEROL: 246 mg/dL — AB (ref 125–200)
HDL: 71 mg/dL (ref >=40)
LDL Cholesterol: 149 mg/dL — ABNORMAL HIGH (ref <130)
TRIGLYCERIDES: 128 mg/dL (ref <150)
VLDL: 26 mg/dL (ref <30)

## 2016-03-23 MED ORDER — ZOSTER VACCINE LIVE 19400 UNT/0.65ML ~~LOC~~ SUSR: 0.6500 mL | Freq: Once | SUBCUTANEOUS | Status: DC

## 2016-03-23 NOTE — Patient Instructions (Addendum)
Your blood pressure is borderline. Consider checking your blood pressure outside of the office. Try to drink more water and less salt. Lets recheck blood pressure in about 6 months unless you notice it being higher outside of the office - then please call so we can get you started on a blood pressure medication.     IF you received an x-ray today, you will receive an invoice from [facility]. Please contact [facility] at [phone] with questions or concerns regarding your invoice.   IF you received labwork today, you will receive an invoice from [employer]. Please contact [facility] at [phone] with questions or concerns regarding your invoice.   Our billing staff will not be able to assist you with questions regarding bills from these companies.  You will be contacted with the lab results as soon as they are available. The fastest way to get your results is to activate your My Chart account. Instructions are located on the last page of this paperwork. If you have not heard from [place] regarding the results in 2 weeks, please contact this office.     Ernest [name] , Thank you for taking time to come for your Medicare Wellness Visit. I appreciate your ongoing commitment to your health goals. Please review the following plan we discussed and let me know if I can assist you in the future.   These are the goals we discussed: Goals    None      This is a list of the screening recommended for you and due dates:  Health Maintenance  Topic Date Due  .  Hepatitis C: One time screening is recommended by Center for Disease Control  (CDC) for  adults born from [birth year] through 1965.   Jun 24, 1951  . HIV Screening  02/13/1966  . Shingles Vaccine  02/14/2011  . Pneumonia vaccines (1 of 2 - PCV13) 02/14/2016  . Flu Shot  05/23/2016  . Colon Cancer Screening  12/25/2019  . Tetanus Vaccine  01/03/2023   Health Maintenance, Male A healthy lifestyle and  preventative care can promote health and wellness.  Maintain regular health, dental, and eye exams.  Eat a healthy diet. Foods like vegetables, fruits, whole grains, low-fat dairy products, and lean protein foods contain the nutrients you need and are low in calories. Decrease your intake of foods high in solid fats, added sugars, and salt. Get information about a proper diet from your health care provider, if necessary.  Regular physical exercise is one of the most important things you can do for your health. Most adults should get at least 150 minutes of moderate-intensity exercise (any activity that increases your heart rate and causes you to sweat) each week. In addition, most adults need muscle-strengthening exercises on 2 or more days a week.   Maintain a healthy weight. The body mass index (BMI) is a screening tool to identify possible weight problems. It provides an estimate of body fat based on height and weight. Your health care provider can find your BMI and can help you achieve or maintain a healthy weight. For males 20 years and older:  A BMI below 18.5 is considered underweight.  A BMI of 18.5 to 24.9 is normal.  A BMI of 25 to 29.9 is considered overweight.  A BMI of 30 and above is considered obese.  Maintain normal blood lipids and cholesterol by exercising and minimizing your intake of saturated fat. Eat a balanced diet with plenty of fruits and vegetables. Blood tests  for lipids and cholesterol should begin at age 13 and be repeated every 5 years. If your lipid or cholesterol levels are high, you are over age 74, or you are at high risk for heart disease, you may need your cholesterol levels checked more frequently.Ongoing high lipid and cholesterol levels should be treated with medicines if diet and exercise are not working.  If you smoke, find out from your health care provider how to quit. If you do not use tobacco, do not start.  Lung cancer screening is recommended for  adults aged 68-80 years who are at high risk for developing lung cancer because of a history of smoking. A yearly low-dose CT scan of the lungs is recommended for people who have at least a 30-pack-year history of smoking and are current smokers or have quit within the past 15 years. A pack year of smoking is smoking an average of 1 pack of cigarettes a day for 1 year (for example, a 30-pack-year history of smoking could mean smoking 1 pack a day for 30 years or 2 packs a day for 15 years). Yearly screening should continue until the smoker has stopped smoking for at least 15 years. Yearly screening should be stopped for people who develop a health problem that would prevent them from having lung cancer treatment.  If you choose to drink alcohol, do not have more than 2 drinks per day. One drink is considered to be 12 oz (360 mL) of beer, 5 oz (150 mL) of wine, or 1.5 oz (45 mL) of liquor.  Avoid the use of street drugs. Do not share needles with anyone. Ask for help if you need support or instructions about stopping the use of drugs.  High blood pressure causes heart disease and increases the risk of stroke. High blood pressure is more likely to develop in:  People who have blood pressure in the end of the normal range (100-139/85-89 mm Hg).  People who are overweight or obese.  People who are African American.  If you are 38-35 years of age, have your blood pressure checked every 3-5 years. If you are 51 years of age or older, have your blood pressure checked every year. You should have your blood pressure measured twice--once when you are at a hospital or clinic, and once when you are not at a hospital or clinic. Record the average of the two measurements. To check your blood pressure when you are not at a hospital or clinic, you can use:  An automated blood pressure machine at a pharmacy.  A home blood pressure monitor.  If you are 56-35 years old, ask your health care provider if you should  take aspirin to prevent heart disease.  Diabetes screening involves taking a blood sample to check your fasting blood sugar level. This should be done once every 3 years after age 72 if you are at a normal weight and without risk factors for diabetes. Testing should be considered at a younger age or be carried out more frequently if you are overweight and have at least 1 risk factor for diabetes.  Colorectal cancer can be detected and often prevented. Most routine colorectal cancer screening begins at the age of 44 and continues through age 45. However, your health care provider may recommend screening at an earlier age if you have risk factors for colon cancer. On a yearly basis, your health care provider may provide home test kits to check for hidden blood in the stool. A small  camera at the end of a tube may be used to directly examine the colon (sigmoidoscopy or colonoscopy) to detect the earliest forms of colorectal cancer. Talk to your health care provider about this at age 37 when routine screening begins. A direct exam of the colon should be repeated every 5-10 years through age 53, unless early forms of precancerous polyps or small growths are found.  People who are at an increased risk for hepatitis B should be screened for this virus. You are considered at high risk for hepatitis B if:  You were born in a country where hepatitis B occurs often. Talk with your health care provider about which countries are considered high risk.  Your parents were born in a high-risk country and you have not received a shot to protect against hepatitis B (hepatitis B vaccine).  You have HIV or AIDS.  You use needles to inject street drugs.  You live with, or have sex with, someone who has hepatitis B.  You are a man who has sex with other men (MSM).  You get hemodialysis treatment.  You take certain medicines for conditions like cancer, organ transplantation, and autoimmune conditions.  Hepatitis C  blood testing is recommended for all people born from [birth year] through 1965 and any individual with known risk factors for hepatitis C.  Healthy men should no longer receive prostate-specific antigen (PSA) blood tests as part of routine cancer screening. Talk to your health care provider about prostate cancer screening.  Testicular cancer screening is not recommended for adolescents or adult males who have no symptoms. Screening includes self-exam, a health care provider exam, and other screening tests. Consult with your health care provider about any symptoms you have or any concerns you have about testicular cancer.  Practice safe sex. Use condoms and avoid high-risk sexual practices to reduce the spread of sexually transmitted infections (STIs).  You should be screened for STIs, including gonorrhea and chlamydia if:  You are sexually active and are younger than 24 years.  You are older than 24 years, and your health care provider tells you that you are at risk for this type of infection.  Your sexual activity has changed since you were last screened, and you are at an increased risk for chlamydia or gonorrhea. Ask your health care provider if you are at risk.  If you are at risk of being infected with HIV, it is recommended that you take a prescription medicine daily to prevent HIV infection. This is called pre-exposure prophylaxis (PrEP). You are considered at risk if:  You are a man who has sex with other men (MSM).  You are a heterosexual man who is sexually active with multiple partners.  You take drugs by injection.  You are sexually active with a partner who has HIV.  Talk with your health care provider about whether you are at high risk of being infected with HIV. If you choose to begin PrEP, you should first be tested for HIV. You should then be tested every 3 months for as long as you are taking PrEP.  Use sunscreen. Apply sunscreen liberally and repeatedly throughout the day. You  should seek shade when your shadow is shorter than you. Protect yourself by wearing long sleeves, pants, a wide-brimmed hat, and sunglasses year round whenever you are outdoors.  Tell your health care provider of new moles or changes in moles, especially if there is a change in shape or color. Also, tell your health care provider if a  mole is larger than the size of a pencil eraser.  A one-time screening for abdominal aortic aneurysm (AAA) and surgical repair of large AAAs by ultrasound is recommended for men aged 60-75 years who are current or former smokers.  Stay current with your vaccines (immunizations).   This information is not intended to replace advice given to you by your health care provider. Make sure you discuss any questions you have with your health care provider.   Document Released: 04/06/2008 Document Revised: 10/30/2014 Document Reviewed: 03/06/2011 Elsevier Interactive Patient Education 2016 [place] DASH stands for "Dietary Approaches to Stop Hypertension." The DASH eating plan is a healthy eating plan that has been shown to reduce high blood pressure (hypertension). Additional health benefits may include reducing the risk of type 2 diabetes mellitus, heart disease, and stroke. The DASH eating plan may also help with weight loss. WHAT DO I NEED TO KNOW ABOUT THE DASH EATING PLAN? For the DASH eating plan, you will follow these general guidelines:  Choose foods with a percent daily value for sodium of less than 5% (as listed on the food label).  Use salt-free seasonings or herbs instead of table salt or sea salt.  Check with your health care provider or pharmacist before using salt substitutes.  Eat lower-sodium products, often labeled as "lower sodium" or "no salt added."  Eat fresh foods.  Eat more vegetables, fruits, and low-fat dairy products.  Choose whole grains. Look for the word "whole" as the first word in the ingredient list.  Choose  fish and skinless chicken or [place] more often than red meat. Limit fish, poultry, and meat to 6 oz (170 g) each day.  Limit sweets, desserts, sugars, and sugary drinks.  Choose heart-healthy fats.  Limit cheese to 1 oz (28 g) per day.  Eat more home-cooked food and less restaurant, buffet, and fast food.  Limit fried foods.  Cook foods using methods other than frying.  Limit canned vegetables. If you do use them, rinse them well to decrease the sodium.  When eating at a restaurant, ask that your food be prepared with less salt, or no salt if possible. WHAT FOODS CAN I EAT? Seek help from a dietitian for individual calorie needs. Grains Whole grain or whole wheat bread. Brown rice. Whole grain or whole wheat pasta. Quinoa, bulgur, and whole grain cereals. Low-sodium cereals. Corn or whole wheat flour tortillas. Whole grain cornbread. Whole grain crackers. Low-sodium crackers. Vegetables Fresh or frozen vegetables (raw, steamed, roasted, or grilled). Low-sodium or reduced-sodium tomato and vegetable juices. Low-sodium or reduced-sodium tomato sauce and paste. Low-sodium or reduced-sodium canned vegetables.  Fruits All fresh, canned (in natural juice), or frozen fruits. Meat and Other Protein Products Ground beef (85% or leaner), grass-fed beef, or beef trimmed of fat. Skinless chicken or [place]. Ground chicken or [place]. Pork trimmed of fat. All fish and seafood. Eggs. Dried beans, peas, or lentils. Unsalted nuts and seeds. Unsalted canned beans. Dairy Low-fat dairy products, such as skim or 1% milk, 2% or reduced-fat cheeses, low-fat ricotta or cottage cheese, or plain low-fat yogurt. Low-sodium or reduced-sodium cheeses. Fats and Oils Tub margarines without trans fats. Light or reduced-fat mayonnaise and salad dressings (reduced sodium). Avocado. Safflower, olive, or canola oils. Natural peanut or almond butter. Other Unsalted popcorn and pretzels. The items listed above may not be  a complete list of recommended foods or beverages. Contact your dietitian for more options. WHAT FOODS ARE NOT RECOMMENDED? Grains White bread. White  pasta. White rice. Refined cornbread. Bagels and croissants. Crackers that contain trans fat. Vegetables Creamed or fried vegetables. Vegetables in a cheese sauce. Regular canned vegetables. Regular canned tomato sauce and paste. Regular tomato and vegetable juices. Fruits Dried fruits. Canned fruit in light or heavy syrup. Fruit juice. Meat and Other Protein Products Fatty cuts of meat. Ribs, chicken wings, bacon, sausage, bologna, salami, chitterlings, fatback, hot dogs, bratwurst, and packaged luncheon meats. Salted nuts and seeds. Canned beans with salt. Dairy Whole or 2% milk, cream, half-and-half, and cream cheese. Whole-fat or sweetened yogurt. Full-fat cheeses or blue cheese. Nondairy creamers and whipped toppings. Processed cheese, cheese spreads, or cheese curds. Condiments Onion and garlic salt, seasoned salt, table salt, and sea salt. Canned and packaged gravies. Worcestershire sauce. Tartar sauce. Barbecue sauce. Teriyaki sauce. Soy sauce, including reduced sodium. Steak sauce. Fish sauce. Oyster sauce. Cocktail sauce. Horseradish. Ketchup and mustard. Meat flavorings and tenderizers. Bouillon cubes. Hot sauce. Tabasco sauce. Marinades. Taco seasonings. Relishes. Fats and Oils Butter, stick margarine, lard, shortening, ghee, and bacon fat. Coconut, palm kernel, or palm oils. Regular salad dressings. Other Pickles and olives. Salted popcorn and pretzels. The items listed above may not be a complete list of foods and beverages to avoid. Contact your dietitian for more information. WHERE CAN I FIND MORE INFORMATION? National Heart, Lung, and Blood Institute: travelstabloid.com   This information is not intended to replace advice given to you by your health care provider. Make sure you discuss any  questions you have with your health care provider.   Document Released: 09/28/2011 Document Revised: 10/30/2014 Document Reviewed: 08/13/2013 Elsevier Interactive Patient Education [employer].

## 2016-03-23 NOTE — Progress Notes (Signed)
Subjective:   Ernest [name] is a 65 y.o. male who presents for a Welcome to Medicare exam.   Review of Systems: See below, all other negative        Objective:    Today's Vitals   03/23/16 1021 03/23/16 1025  BP: 168/72 144/78  Pulse: 57   Temp: 98.1 F (36.7 C)   TempSrc: Oral   Resp: 16   Height: 6\' 1"  (1.854 m)   Weight: 174 lb 6.4 oz (79.107 kg)    Body mass index is 23.01 kg/(m^2).  Medications Outpatient Encounter Prescriptions as of 03/23/2016  Medication Sig  . HYDROcodone-acetaminophen (NORCO) 5-325 MG per tablet Take 1 tablet by mouth every 6 (six) hours as needed for moderate pain. (Patient not taking: Reported on 03/23/2016)  . [DISCONTINUED] cephALEXin (KEFLEX) 500 MG capsule Take 1 capsule (500 mg total) by mouth 3 (three) times daily.   No facility-administered encounter medications on file as of 03/23/2016.     History: No past medical history on file. Past Surgical History  Procedure Laterality Date  . Spine surgery    . Kidney stone removal      Family History  Problem Relation Age of Onset  . Heart disease Father     heart attack  . COPD Daughter   . Diabetes Maternal Grandmother    Social History   Occupational History  . Site Supervisor    Social History Main Topics  . Smoking status: Former Research scientist (life sciences)  . Smokeless tobacco: Not on file  . Alcohol Use: Yes     Comment: social  . Drug Use: No  . Sexual Activity:    Partners: Female   Tobacco Counseling Counseling given: Not Answered   Immunizations and Health Maintenance Immunization History  Administered Date(s) Administered  . Tdap 01/02/2013   Health Maintenance Due  Topic Date Due  . Hepatitis C Screening  Jun 06, 1951  . HIV Screening  02/13/1966  . ZOSTAVAX  02/14/2011  . PNA vac Low Risk Adult (1 of 2 - PCV13) 02/14/2016    Activities of Daily Living No flowsheet data found.  Physical Exam  nml (optional), or other factors deemed appropriate based on the  beneficiary's medical and social history and current clinical standards. Nursing note and vitals reviewed. Constitutional: He is oriented to person, place, and time. Vital signs are normal. He appears well-developed and well-nourished. No distress.  HENT:  Head: Normocephalic and atraumatic.  Right Ear: Hearing, external ear and ear canal normal. Tympanic membrane is scarred. Tympanic membrane is not erythematous. No middle ear effusion.  Left Ear: Hearing, external ear and ear canal normal. Tympanic membrane is scarred. Tympanic membrane is not erythematous. No middle ear effusion.  Nose: Nose normal. No mucosal edema, nasal deformity or septal deviation.  Mouth/Throat: Uvula is midline, oropharynx is clear and moist and mucous membranes are normal. No oral lesions. Normal dentition. No dental caries.  Eyes: Conjunctivae, EOM and lids are normal. Pupils are equal, round, and reactive to light. No scleral icterus Neck: Neck supple. No thyromegaly present.  Cardiovascular: Normal rate, regular rhythm, normal heart sounds and intact distal pulses. Exam reveals no gallop and no friction rub.  No murmur heard. Pulmonary/Chest: Effort normal and breath sounds normal. No respiratory distress. He exhibits no tenderness.  Abdominal: Soft. Normal appearance, normal aorta and bowel sounds are normal. He exhibits no distension, no pulsatile midline mass and no mass. There is no hepatosplenomegaly. There is no tenderness. There is no guarding and no  CVA tenderness. No hernia.  Genitourinary: Rectal exam shows no external hemorrhoid, no fissure, no mass and no tenderness. Prostate is not tender.  Musculoskeletal: Normal range of motion. He exhibits no edema and no tenderness.  Lymphadenopathy:   He has no cervical adenopathy.  Neurological: He is alert and oriented to person, place, and time. He has normal patellar reflexes. No cranial nerve deficit. He exhibits normal muscle tone. Coordination normal.   Skin: Skin is warm and dry. No rash noted. No erythema. No pallor.    Advanced Directives:  reviewed    Assessment:    This is a routine wellness  examination for this patient .  1. Welcome to Medicare preventive visit   2. Family history of early CAD   3. Routine screening for STI (sexually transmitted infection)   4. Screening for cardiovascular, respiratory, and genitourinary diseases   5. Screening for colorectal cancer   6. Screening for deficiency anemia   7. Screening for prostate cancer   8. Screening for thyroid disorder   9. Hyperlipidemia   10. Cerumen impaction, right   11. Elevated blood pressure - borderline, try checking outside office, work on tlc. rehceck in 6 mos unless worsening - then call so we can start on bp med.  12. Primary osteoarthritis of right hand      Vision/Hearing screen  Visual Acuity Screening   Right eye Left eye Both eyes  Without correction:     With correction: 20/20 20/25 20/20     Dietary issues and exercise activities discussed:     Goals    None      Depression Screen PHQ 2/9 Scores 03/23/2016 07/07/2015  PHQ - 2 Score 0 0     Fall Risk Fall Risk  03/23/2016  Falls in the past year? No    MMSE: No flowsheet data found.  No gross abnormality noted on exam   PCP: [facility] No other providers     Plan:     During the course of the visit,Ernest [name] was educated and counseled about the following appropriate screening and preventive services:   Vaccines to include Pneumoccal, Influenza, Hepatitis B, Td, Zostavax, HCV  Electrocardiogram  Cardiovascular Disease  Colorectal cancer screening  Diabetes screening  Glaucoma screening  Nutrition counseling  Prostate cancer screening  Smoking cessation counseling  His current medications and allergies were reviewed and needed refills of his chronic medications were ordered. The plan for yearly health maintenance was discussed all orders and referrals were made as  appropriate.  Patient Instructions (the written plan) was given to the patient.   Delman Cheadle, MD 03/23/2016   Orders Placed This Encounter  Procedures  . Pneumococcal conjugate vaccine 13-valent IM  . PSA, Medicare  . Comprehensive metabolic panel    Order Specific Question:  Has the patient fasted?    Answer:  Yes  . Lipid panel    Order Specific Question:  Has the patient fasted?    Answer:  Yes  . Hepatitis C Antibody  . HIV antibody  . Ambulatory referral to Gastroenterology    Referral Priority:  Routine    Referral Type:  Consultation    Referral Reason:  Specialty Services Required    Referred to Provider:  Ladene Artist, MD    Number of Visits Requested:  1  . Ear wax removal    right  . POCT urinalysis dipstick  . EKG 12-Lead    Meds ordered this encounter  Medications  . Zoster Vaccine Live, PF, (ZOSTAVAX)  19400 UNT/0.65ML injection    Sig: Inject 19,400 Units into the skin once.    Dispense:  1 vial    Refill:  0     Delman Cheadle, M.D.  [facility] [address] [place], [place] [address] [phone] phone [phone] fax  04/19/2016 8:55 PM

## 2016-03-24 LAB — PSA, MEDICARE: PSA: 0.58 ng/mL (ref <=4.00)

## 2016-03-24 LAB — HEPATITIS C ANTIBODY: HCV Ab: NEGATIVE

## 2016-03-24 LAB — HIV ANTIBODY (ROUTINE TESTING W REFLEX): HIV: NONREACTIVE

## 2016-03-27 ENCOUNTER — Telehealth: Payer: Self-pay | Admitting: Internal Medicine

## 2016-03-27 NOTE — Telephone Encounter (Signed)
OK 

## 2016-03-27 NOTE — Telephone Encounter (Signed)
Dr. Fuller Plan will you accept pt? See note below.

## 2016-03-27 NOTE — Telephone Encounter (Signed)
Pt requesting to switch care to Dr. Fuller Plan, states spouse sees Dr. Fuller Plan. Dr. Carlean Purl will you approve the switch? Please advise.

## 2016-04-04 ENCOUNTER — Encounter: Payer: Self-pay | Admitting: Gastroenterology

## 2016-04-07 ENCOUNTER — Ambulatory Visit (AMBULATORY_SURGERY_CENTER): Payer: Self-pay

## 2016-04-07 VITALS — Ht 73.0 in | Wt 174.4 lb

## 2016-04-07 DIAGNOSIS — Z860100 Personal history of colon polyps, unspecified: Secondary | ICD-10-CM

## 2016-04-07 MED ORDER — SUPREP BOWEL PREP KIT 17.5-3.13-1.6 GM/177ML PO SOLN: 1.0000 | Freq: Once | ORAL | Status: DC

## 2016-04-07 NOTE — Progress Notes (Signed)
No allergies to eggs or soy No diet meds No home oxygen No past problems with anesthesia  Declined emmi 

## 2016-04-10 ENCOUNTER — Encounter: Payer: Self-pay | Admitting: Gastroenterology

## 2016-04-17 ENCOUNTER — Encounter: Payer: Self-pay | Admitting: Gastroenterology

## 2016-04-17 ENCOUNTER — Ambulatory Visit (AMBULATORY_SURGERY_CENTER): Payer: Commercial Managed Care - HMO | Admitting: Gastroenterology

## 2016-04-17 VITALS — BP 120/51 | HR 48 | Temp 97.7°F | Resp 14 | Ht 73.0 in | Wt 174.0 lb

## 2016-04-17 DIAGNOSIS — D125 Benign neoplasm of sigmoid colon: Secondary | ICD-10-CM | POA: Diagnosis not present

## 2016-04-17 DIAGNOSIS — Z86010 Personal history of colonic polyps: Secondary | ICD-10-CM

## 2016-04-17 DIAGNOSIS — D123 Benign neoplasm of transverse colon: Secondary | ICD-10-CM | POA: Diagnosis not present

## 2016-04-17 DIAGNOSIS — D124 Benign neoplasm of descending colon: Secondary | ICD-10-CM

## 2016-04-17 MED ORDER — SODIUM CHLORIDE 0.9 % IV SOLN: 500.0000 mL | INTRAVENOUS | Status: DC

## 2016-04-17 NOTE — Progress Notes (Signed)
Called to room to assist during endoscopic procedure.  Patient ID and intended procedure confirmed with present staff. Received instructions for my participation in the procedure from the performing physician.  

## 2016-04-17 NOTE — Op Note (Signed)
[facility] Patient Name: Ernest [name] Procedure Date: 04/17/2016 1:11 PM MRN: [id number] Endoscopist: Ladene Artist , MD Age: 65 Referring MD:  Date of Birth: Nov 25, 1950 Gender: Male Account #: [id number] Procedure:                Colonoscopy Indications:              Surveillance: Personal history of adenomatous                            polyps on last colonoscopy > 5 years ago Medicines:                Monitored Anesthesia Care Procedure:                Pre-Anesthesia Assessment:                           - Prior to the procedure, a History and Physical                            was performed, and patient medications and                            allergies were reviewed. The patient's tolerance of                            previous anesthesia was also reviewed. The risks                            and benefits of the procedure and the sedation                            options and risks were discussed with the patient.                            All questions were answered, and informed consent                            was obtained. Prior Anticoagulants: The patient has                            taken no previous anticoagulant or antiplatelet                            agents. ASA Grade Assessment: II - A patient with                            mild systemic disease. After reviewing the risks                            and benefits, the patient was deemed in                            satisfactory condition to undergo the procedure.  After obtaining informed consent, the colonoscope                            was passed under direct vision. Throughout the                            procedure, the patient's blood pressure, pulse, and                            oxygen saturations were monitored continuously. The                            Model PCF-H190L [id number]) scope was introduced                            through the anus and  advanced to the the cecum,                            identified by appendiceal orifice and ileocecal                            valve. The colonoscopy was performed without                            difficulty. The patient tolerated the procedure                            well. The quality of the bowel preparation was                            good. The ileocecal valve, appendiceal orifice, and                            rectum were photographed. Scope In: 1:34:25 PM Scope Out: 1:49:58 PM Scope Withdrawal Time: 0 hours 12 minutes 57 seconds  Total Procedure Duration: 0 hours 15 minutes 33 seconds  Findings:                 The digital rectal exam was normal.                           Two sessile polyps were found in the sigmoid colon                            and descending colon. The polyps were 5 to 7 mm in                            size. These polyps were removed with a cold snare.                            Resection and retrieval were complete.                           A 5 mm polyp was found in  the transverse colon. The                            polyp was sessile. The polyp was removed with a                            cold biopsy forceps. Resection and retrieval were                            complete.                           Many small-mouthed diverticula were found in the                            sigmoid colon. There was narrowing of the colon in                            association with the diverticular opening. There                            was evidence of diverticular spasm. There was no                            evidence of diverticular bleeding.                           The exam was otherwise normal throughout the                            examined colon.                           The retroflexed view of the distal rectum and anal                            verge was normal and showed no anal or rectal                             abnormalities. Complications:            No immediate complications. Estimated Blood Loss:     Estimated blood loss: none. Impression:               - Two 5 to 7 mm polyps in the sigmoid colon and in                            the descending colon, removed with a cold snare.                            Resected and retrieved.                           - One 5 mm polyp in the transverse colon, removed  with a cold biopsy forceps. Resected and retrieved.                           - Moderate diverticulosis in the sigmoid colon.                           - The distal rectum and anal verge are normal on                            retroflexion view. Recommendation:           - Patient has a contact number available for                            emergencies. The signs and symptoms of potential                            delayed complications were discussed with the                            patient. Return to normal activities tomorrow.                            Written discharge instructions were provided to the                            patient.                           - High fiber diet.                           - Continue present medications.                           - Await pathology results.                           - Repeat colonoscopy in 5 years for surveillance. Ladene Artist, MD 04/17/2016 1:55:00 PM This report has been signed electronically.

## 2016-04-17 NOTE — Patient Instructions (Signed)
YOU HAD AN ENDOSCOPIC PROCEDURE TODAY AT [facility]:   Refer to the procedure report that was given to you for any specific questions about what was found during the examination.  If the procedure report does not answer your questions, please call your gastroenterologist to clarify.  If you requested that your care partner not be given the details of your procedure findings, then the procedure report has been included in a sealed envelope for you to review at your convenience later.  YOU SHOULD EXPECT: Some feelings of bloating in the abdomen. Passage of more gas than usual.  Walking can help get rid of the air that was put into your GI tract during the procedure and reduce the bloating. If you had a lower endoscopy (such as a colonoscopy or flexible sigmoidoscopy) you may notice spotting of blood in your stool or on the toilet paper. If you underwent a bowel prep for your procedure, you may not have a normal bowel movement for a few days.  Please Note:  You might notice some irritation and congestion in your nose or some drainage.  This is from the oxygen used during your procedure.  There is no need for concern and it should clear up in a day or so.  SYMPTOMS TO REPORT IMMEDIATELY:   Following lower endoscopy (colonoscopy or flexible sigmoidoscopy):  Excessive amounts of blood in the stool  Significant tenderness or worsening of abdominal pains  Swelling of the abdomen that is new, acute  Fever of 100F or higher   For urgent or emergent issues, a gastroenterologist can be reached at any hour by calling [phone].   DIET: Your first meal following the procedure should be a small meal and then it is ok to progress to your normal diet. Heavy or fried foods are harder to digest and may make you feel nauseous or bloated.  Likewise, meals heavy in dairy and vegetables can increase bloating.  Drink plenty of fluids but you should avoid alcoholic beverages for 24 hours. Try to  increase the fiber in your diet, and drink plenty of fluids.  ACTIVITY:  You should plan to take it easy for the rest of today and you should NOT DRIVE or use heavy machinery until tomorrow (because of the sedation medicines used during the test).    FOLLOW UP: Our staff will call the number listed on your records the next business day following your procedure to check on you and address any questions or concerns that you may have regarding the information given to you following your procedure. If we do not reach you, we will leave a message.  However, if you are feeling well and you are not experiencing any problems, there is no need to return our call.  We will assume that you have returned to your regular daily activities without incident.  If any biopsies were taken you will be contacted by phone or by letter within the next 1-3 weeks.  Please call us at [phone] if you have not heard about the biopsies in 3 weeks.    SIGNATURES/CONFIDENTIALITY: You and/or your care partner have signed paperwork which will be entered into your electronic medical record.  These signatures attest to the fact that that the information above on your After Visit Summary has been reviewed and is understood.  Full responsibility of the confidentiality of this discharge information lies with you and/or your care-partner.  Read all of the handouts given to you by your recovery  room nurse.  Thank-you for choosing us for your healthcare needs today. 

## 2016-04-17 NOTE — Progress Notes (Signed)
A/ox3, pleased with MAC, report to RN 

## 2016-04-18 ENCOUNTER — Telehealth: Payer: Self-pay | Admitting: *Deleted

## 2016-04-18 NOTE — Telephone Encounter (Signed)
  Follow up Call-  Call back number 04/17/2016  Post procedure Call Back phone  # [phone]  Permission to leave phone message Yes     Patient questions:  Do you have a fever, pain , or abdominal swelling? No. Pain Score  0 *  Have you tolerated food without any problems? Yes.    Have you been able to return to your normal activities? Yes.    Do you have any questions about your discharge instructions: Diet   No. Medications  No. Follow up visit  No.  Do you have questions or concerns about your Care? No.  Actions: * If pain score is 4 or above: No action needed, pain <4.

## 2016-04-26 ENCOUNTER — Encounter: Payer: Self-pay | Admitting: Internal Medicine

## 2016-04-28 ENCOUNTER — Ambulatory Visit (HOSPITAL_COMMUNITY)
Admission: RE | Admit: 2016-04-28 | Discharge: 2016-04-28 | Disposition: A | Discharge status: Home / Self Care | Payer: Commercial Managed Care - HMO | Source: Ambulatory Visit | Attending: Vascular Surgery | Admitting: Vascular Surgery

## 2016-04-28 DIAGNOSIS — E785 Hyperlipidemia, unspecified: Secondary | ICD-10-CM | POA: Insufficient documentation

## 2016-04-28 DIAGNOSIS — Z1212 Encounter for screening for malignant neoplasm of rectum: Secondary | ICD-10-CM | POA: Diagnosis not present

## 2016-04-28 DIAGNOSIS — Z1211 Encounter for screening for malignant neoplasm of colon: Secondary | ICD-10-CM | POA: Diagnosis not present

## 2016-04-28 DIAGNOSIS — Z0000 Encounter for general adult medical examination without abnormal findings: Secondary | ICD-10-CM | POA: Insufficient documentation

## 2016-04-30 ENCOUNTER — Encounter: Payer: Self-pay | Admitting: Gastroenterology

## 2016-05-02 ENCOUNTER — Encounter: Payer: Self-pay | Admitting: Gastroenterology

## 2016-09-27 DIAGNOSIS — E785 Hyperlipidemia, unspecified: Secondary | ICD-10-CM | POA: Insufficient documentation

## 2016-09-27 NOTE — Progress Notes (Signed)
Subjective:    Patient ID: Ernest [name], male    DOB: 03/23/1951, 65 y.o.   MRN: [id number] Chief Complaint  Patient presents with  . Follow-up  . finger issues    follow up regarding the issues with his fingers on both hands    HPI Ernest [name] is a delightful 65 yo [name] here today for a 6 mos follow-up on his chronic medical conditions. He is not on any prescription medications  HPL: non-hdl 175 on last lipids 6 mos prior. LDL 149. This gave him a ASCVD risk score of 10.8%This was not sig changed from when last checked in 2014.  He did try cholesterol medication years prior and tolaterated it but did lifestyle changes and was able to get off of it.  Elevated BP:  Has been checking outside of office - occasionally and usually about 130s. Does not want to start on medication. No formal exercise but plans to.  His mother passed away and so is going resume physical exercise.   Was doing heavy doses of fish oil but didn't help with the joints at all.  Wife with cong   Past Medical History:  Diagnosis Date  . Cyst in hand    "fluid coming out of joints in fingers"; trying fish oil  . Hx of cardiac cath 2002   negative  . Hyperlipemia   . Kidney stones    Past Surgical History:  Procedure Laterality Date  . kidney stone removal    . SPINE SURGERY  11/2007   ruptured disc  . VASECTOMY  1977  . WISDOM TOOTH EXTRACTION     Current Outpatient Prescriptions on File Prior to Visit  Medication Sig Dispense Refill  . Multiple Vitamin (MULTIVITAMIN) tablet Take 1 tablet by mouth daily.    Marland Kitchen Zoster Vaccine Live, PF, (ZOSTAVAX) 29562 UNT/0.65ML injection Inject 19,400 Units into the skin once. (Patient not taking: Reported on 09/28/2016) 1 vial 0   No current facility-administered medications on file prior to visit.    No Known Allergies Family History  Problem Relation Age of Onset  . Heart disease Father     heart attack  . COPD Daughter   . Diabetes Maternal Grandmother   .  Colon cancer Neg Hx    Social History   Social History  . Marital status: Married    Spouse name: N/A  . Number of children: N/A  . Years of education: N/A   Occupational History  . Site Supervisor [employer]   Social History Main Topics  . Smoking status: Former Research scientist (life sciences)  . Smokeless tobacco: Never Used     Comment: quit around 1997  . Alcohol use 0.0 oz/week     Comment: social  . Drug use: No  . Sexual activity: Yes    Partners: Female   Other Topics Concern  . None   Social History Narrative   Married. Education:High School/Other.   Depression screen [facility] 2/9 09/28/2016 03/23/2016 07/07/2015  Decreased Interest 0 0 0  Down, Depressed, Hopeless 0 0 0  PHQ - 2 Score 0 0 0    Review of Systems see hpi    Objective:   Physical Exam  Constitutional: He is oriented to person, place, and time. He appears well-developed and well-nourished. No distress.  HENT:  Head: Normocephalic and atraumatic.  Eyes: Conjunctivae are normal. Pupils are equal, round, and reactive to light. No scleral icterus.  Neck: Normal range of motion. Neck supple. No thyromegaly present.  Cardiovascular: Normal rate, regular rhythm, normal heart sounds and intact distal pulses.   Pulmonary/Chest: Effort normal and breath sounds normal. No respiratory distress.  Musculoskeletal: He exhibits no edema.  Lymphadenopathy:    He has no cervical adenopathy.  Neurological: He is alert and oriented to person, place, and time.  Skin: Skin is warm and dry. He is not diaphoretic.  Psychiatric: He has a normal mood and affect. His behavior is normal.      BP (!) 150/80   Pulse (!) 57   Resp 16   Ht 6\' 1"  (1.854 m)   Wt 178 lb 6.4 oz (80.9 kg)   SpO2 98%   BMI 23.54 kg/m      Assessment & Plan:  Schedule initial AWV in 6 mos. rec starting statin but pt would like to work more on tlc first - then does not need labs today. -  Hand arthritis Low back pain Failed Capsaicin, fish oil, otc  nsaids hand surg referral prn. Tri top nsaid  1. Hyperlipidemia, unspecified hyperlipidemia type   2. Elevated blood pressure reading   3. Osteoarthritis of finger, unspecified laterality   4. Chronic midline low back pain without sciatica      Meds ordered this encounter  Medications  . Diclofenac Sodium 2 % SOLN    Sig: Place 1 application onto the skin 2 (two) times daily as needed (arthritis).    Dispense:  112 g    Refill:  2      Delman Cheadle, M.D.  [facility] [address] [place], [place] [address] [phone] phone [phone] fax  10/27/16 12:10 PM

## 2016-09-28 ENCOUNTER — Ambulatory Visit (INDEPENDENT_AMBULATORY_CARE_PROVIDER_SITE_OTHER): Payer: Commercial Managed Care - HMO | Admitting: Family Medicine

## 2016-09-28 ENCOUNTER — Encounter: Payer: Self-pay | Admitting: Family Medicine

## 2016-09-28 ENCOUNTER — Other Ambulatory Visit: Payer: Self-pay

## 2016-09-28 VITALS — BP 150/80 | HR 57 | Temp 98.5°F | Resp 16 | Ht 73.0 in | Wt 178.4 lb

## 2016-09-28 DIAGNOSIS — M25541 Pain in joints of right hand: Secondary | ICD-10-CM

## 2016-09-28 DIAGNOSIS — E785 Hyperlipidemia, unspecified: Secondary | ICD-10-CM

## 2016-09-28 DIAGNOSIS — M25542 Pain in joints of left hand: Principal | ICD-10-CM

## 2016-09-28 DIAGNOSIS — R030 Elevated blood-pressure reading, without diagnosis of hypertension: Secondary | ICD-10-CM | POA: Diagnosis not present

## 2016-09-28 DIAGNOSIS — M19049 Primary osteoarthritis, unspecified hand: Secondary | ICD-10-CM | POA: Diagnosis not present

## 2016-09-28 DIAGNOSIS — M545 Low back pain, unspecified: Secondary | ICD-10-CM

## 2016-09-28 DIAGNOSIS — G8929 Other chronic pain: Secondary | ICD-10-CM

## 2016-09-28 MED ORDER — DICLOFENAC SODIUM 1 % TD GEL: 4.0000 g | Freq: Two times a day (BID) | TRANSDERMAL | 2 refills | Status: DC

## 2016-09-28 MED ORDER — DICLOFENAC SODIUM 1 % TD GEL: 4.0000 g | Freq: Two times a day (BID) | TRANSDERMAL | Status: DC

## 2016-09-28 MED ORDER — DICLOFENAC SODIUM 2 % TD SOLN: 1.0000 "application " | Freq: Two times a day (BID) | TRANSDERMAL | 2 refills | Status: DC | PRN

## 2016-09-28 NOTE — Progress Notes (Unsigned)
Diclofenac solution ordered w/2 refills  Reordered diclofenac gel for arthritis pain in hands. Unable to put in refills.

## 2016-09-28 NOTE — Patient Instructions (Signed)
     IF you received an x-ray today, you will receive an invoice from [facility]. Please contact [facility] at [phone] with questions or concerns regarding your invoice.   IF you received labwork today, you will receive an invoice from [facility]. Please contact [facility] at [phone] with questions or concerns regarding your invoice.   Our billing staff will not be able to assist you with questions regarding bills from these companies.  You will be contacted with the lab results as soon as they are available. The fastest way to get your results is to activate your My Chart account. Instructions are located on the last page of this paperwork. If you have not heard from us regarding the results in 2 weeks, please contact this office.      

## 2016-11-13 DIAGNOSIS — H524 Presbyopia: Secondary | ICD-10-CM | POA: Diagnosis not present

## 2017-07-27 ENCOUNTER — Telehealth: Payer: Self-pay

## 2017-07-27 NOTE — Telephone Encounter (Signed)
Called pt to schedule Medicare Annual Wellness Visit. -nr  

## 2017-10-03 ENCOUNTER — Encounter: Payer: Commercial Managed Care - HMO | Admitting: Family Medicine

## 2017-10-06 ENCOUNTER — Other Ambulatory Visit: Payer: Self-pay

## 2017-10-06 ENCOUNTER — Encounter: Payer: Self-pay | Admitting: Family Medicine

## 2017-10-06 ENCOUNTER — Ambulatory Visit (INDEPENDENT_AMBULATORY_CARE_PROVIDER_SITE_OTHER): Payer: Medicare HMO | Admitting: Family Medicine

## 2017-10-06 VITALS — BP 122/68 | HR 61 | Temp 98.2°F | Resp 18 | Ht 73.0 in | Wt 179.2 lb

## 2017-10-06 DIAGNOSIS — Z1383 Encounter for screening for respiratory disorder NEC: Secondary | ICD-10-CM | POA: Diagnosis not present

## 2017-10-06 DIAGNOSIS — Z125 Encounter for screening for malignant neoplasm of prostate: Secondary | ICD-10-CM

## 2017-10-06 DIAGNOSIS — Z0000 Encounter for general adult medical examination without abnormal findings: Secondary | ICD-10-CM | POA: Diagnosis not present

## 2017-10-06 DIAGNOSIS — Z23 Encounter for immunization: Secondary | ICD-10-CM

## 2017-10-06 DIAGNOSIS — E785 Hyperlipidemia, unspecified: Secondary | ICD-10-CM

## 2017-10-06 DIAGNOSIS — Z1211 Encounter for screening for malignant neoplasm of colon: Secondary | ICD-10-CM

## 2017-10-06 DIAGNOSIS — M546 Pain in thoracic spine: Secondary | ICD-10-CM

## 2017-10-06 DIAGNOSIS — Z136 Encounter for screening for cardiovascular disorders: Secondary | ICD-10-CM | POA: Diagnosis not present

## 2017-10-06 DIAGNOSIS — Z1212 Encounter for screening for malignant neoplasm of rectum: Secondary | ICD-10-CM | POA: Diagnosis not present

## 2017-10-06 DIAGNOSIS — Z8249 Family history of ischemic heart disease and other diseases of the circulatory system: Secondary | ICD-10-CM | POA: Diagnosis not present

## 2017-10-06 DIAGNOSIS — R1032 Left lower quadrant pain: Secondary | ICD-10-CM | POA: Diagnosis not present

## 2017-10-06 DIAGNOSIS — Z1389 Encounter for screening for other disorder: Secondary | ICD-10-CM

## 2017-10-06 LAB — POCT URINALYSIS DIP (MANUAL ENTRY)
Bilirubin, UA: NEGATIVE
Glucose, UA: NEGATIVE mg/dL
Ketones, POC UA: NEGATIVE mg/dL
Leukocytes, UA: NEGATIVE
Nitrite, UA: NEGATIVE
Protein Ur, POC: NEGATIVE mg/dL
Spec Grav, UA: 1.015 (ref 1.010–1.025)
Urobilinogen, UA: 0.2 E.U./dL
pH, UA: 6.5 (ref 5.0–8.0)

## 2017-10-06 LAB — IFOBT (OCCULT BLOOD): IFOBT: NEGATIVE

## 2017-10-06 MED ORDER — RED YEAST RICE EXTRACT 600 MG PO TABS: 1200.0000 mg | ORAL_TABLET | Freq: Every day | ORAL | Status: DC

## 2017-10-06 MED ORDER — TIZANIDINE HCL 2 MG PO CAPS: 2.0000 mg | ORAL_CAPSULE | Freq: Every day | ORAL | 0 refills | Status: DC

## 2017-10-06 MED ORDER — COENZYME Q10 30 MG PO CAPS: 30.0000 mg | ORAL_CAPSULE | Freq: Every day | ORAL | Status: DC

## 2017-10-06 NOTE — Progress Notes (Addendum)
Subjective:  By signing my name below, I, Moises Blood, attest that this documentation has been prepared under the direction and in the presence of Delman Cheadle, MD. Electronically Signed: Moises Blood, [place]. 10/06/2017 , 10:00 AM .  Patient was seen in Room 2 .   Patient ID: Ernest [name], male    DOB: [birth year], 66 y.o.   MRN: [id number] Chief Complaint  Patient presents with  . Annual Exam   HPI Primary Preventative Screenings: Prostate Cancer: DRE done today; 1 younger brother taking medication for prostate.  Colorectal Cancer: Colonoscopy done last year, 2 polyps removed.  Cardiac: baseline EKG done today.  Weight/Blood sugar/Diet/Exercise:  He tends to eat well at home, but does note ordering "an extra appetizer when [he] shouldn't have" when he goes out to eat. He does drink a protein shake 3-4 times a week as a meal substitution.   BMI Readings from Last 3 Encounters:  10/06/17 23.64 kg/m  09/28/16 23.54 kg/m  04/17/16 22.96 kg/m   No results found for: HGBA1C OTC/Vit/Supp/Herbal: He takes yeast rice supplement, Coenzyme Q10 and multivitamin.  Immunizations:  Immunization History  Administered Date(s) Administered  . Influenza-Unspecified 08/04/2016  . Pneumococcal Conjugate-13 03/23/2016  . Pneumococcal Polysaccharide-23 10/06/2017  . Tdap 01/02/2013   Received Pneumovax-23 today.   Chronic Medical Conditions: Hyperlipidemia: He was on cholesterol medication prior but was able to stop after lifestyle changes. However, last ASCVD risk score from lipids done in June 2017 was 10.8% with LDL 149 and non-HDL 175. Patient wanted to attempt TLC before attempting statins as recommended.   ROS complaint Abdominal pain: He mentions noticing LLQ abdominal pain if he touched a certain spot about 6-0.1 weeks ago. He woke up in the middle of the night due to pain. However, the next day, his pain started to ease up. He noticed darker stools once, but no blood in stool. He  denies any other changes in his bowels. He states it's completely resolved now. He denies any constipation preceding his pain.   Right Upper back pain: He reports sharp right upper back pain about 6 weeks ago. If he rolls over in bed, he can feel the sharp pain. He informs feeling it more in the morning and at night. He takes an occasional tylenol. He is right hand dominant.   Past Medical History:  Diagnosis Date  . Cyst in hand    "fluid coming out of joints in fingers"; trying fish oil  . Hx of cardiac cath 2002   negative  . Hyperlipemia   . Kidney stones    Past Surgical History:  Procedure Laterality Date  . kidney stone removal    . SPINE SURGERY  11/2007   ruptured disc  . VASECTOMY  1977  . WISDOM TOOTH EXTRACTION     Current Outpatient Medications on File Prior to Visit  Medication Sig Dispense Refill  . diclofenac sodium (VOLTAREN) 1 % GEL Apply 4 g topically 2 (two) times daily. As needed for arthritis pain in hands 1 Tube 2  . Multiple Vitamin (MULTIVITAMIN) tablet Take 1 tablet by mouth daily.     No current facility-administered medications on file prior to visit.    No Known Allergies Family History  Problem Relation Age of Onset  . Heart disease Father        heart attack  . COPD Daughter   . Diabetes Maternal Grandmother   . Colon cancer Neg Hx    Social History   Socioeconomic History  .  Marital status: Married    Spouse name: None  . Number of children: None  . Years of education: None  . Highest education level: None  Social Needs  . Financial resource strain: None  . Food insecurity - worry: None  . Food insecurity - inability: None  . Transportation needs - medical: None  . Transportation needs - non-medical: None  Occupational History  . Occupation: Magazine features editor: [place] Use  . Smoking status: Former Research scientist (life sciences)  . Smokeless tobacco: Never Used  . Tobacco comment: quit around 1997  Substance and Sexual  Activity  . Alcohol use: Yes    Alcohol/week: 0.0 oz    Comment: social  . Drug use: No  . Sexual activity: Yes    Partners: Female  Other Topics Concern  . None  Social History Narrative   Married. Education:High School/Other.   Depression screen [facility] 2/9 10/06/2017 09/28/2016 03/23/2016 07/07/2015  Decreased Interest 0 0 0 0  Down, Depressed, Hopeless 0 0 0 0  PHQ - 2 Score 0 0 0 0    Review of Systems  Constitutional: Negative for fatigue and unexpected weight change.  HENT: Positive for tinnitus.   Eyes: Positive for visual disturbance.  Respiratory: Negative for cough, chest tightness and shortness of breath.   Cardiovascular: Negative for chest pain, palpitations and leg swelling.  Gastrointestinal: Positive for abdominal pain (lower 2 wks prev - very severe x sev d ). Negative for blood in stool.  Musculoskeletal: Positive for back pain (dull & sharp pain in upper back), joint swelling and neck pain.  Neurological: Negative for dizziness, light-headedness and headaches.  All other systems reviewed and are negative.      Objective:   Physical Exam  Constitutional: He is oriented to person, place, and time. He appears well-developed and well-nourished. No distress.  HENT:  Head: Normocephalic and atraumatic.  Bilateral TM's with cerumen  Eyes: EOM are normal. Pupils are equal, round, and reactive to light.  Neck: Neck supple. No thyromegaly present.  Cardiovascular: Normal rate.  Pulmonary/Chest: Effort normal. No respiratory distress.  Abdominal: Soft. Bowel sounds are normal. He exhibits no distension.  Genitourinary: Prostate normal.  Musculoskeletal: Normal range of motion.  Lymphadenopathy:    He has no cervical adenopathy.  Neurological: He is alert and oriented to person, place, and time.  Skin: Skin is warm and dry.  Psychiatric: He has a normal mood and affect. His behavior is normal.  Nursing note and vitals reviewed.   Visual Acuity Screening   Right eye  Left eye Both eyes  Without correction:     With correction: 20/20 20/20 20/20   Hearing Screening Comments: Pt could hear whispered colors for whisper test.    BP 122/68 (BP Location: Left Arm, Patient Position: Sitting, Cuff Size: Normal)   Pulse 61   Temp 98.2 F (36.8 C) (Oral)   Resp 18   Ht 6\' 1"  (1.854 m)   Wt 179 lb 3.2 oz (81.3 kg)   SpO2 99%   BMI 23.64 kg/m   EKG: sinus bradycardia, no acute ischemic changes noted. No significant change noted when compared to prior EKG done 11/08/2009.   I have personally reviewed the EKG tracing and agree with the computer interpretation. .     Assessment & Plan:   1. Annual physical exam   2. Screening for cardiovascular, respiratory, and genitourinary diseases   3. Screening for colorectal cancer   4. Screening for prostate cancer  5. Hyperlipidemia, unspecified hyperlipidemia type - non-hdl 175 on last lipids 18 mos prior. LDL 149. This gave him a ASCVD risk score of 10.8%This was not sig changed from when last checked in 2014.  He did try cholesterol medication years prior and tolaterated it but did lifestyle changes and was able to get off of it.  His baseline ASCVD risk 9.6% which he is currently at this year when we also put in the levels from 18 mos prior - however, still recommend starting statin. Will send pravastatin 20 into pharmacy.  Consider hs-crp at f/u OV or have pt come in for this and cons coronary artery calcium score if he does NOT want to start trial of statin  6. Family history of early CAD   16. Abdominal pain, left lower quadrant - might have been diverticuli - rec RTC if sxs recur  8. Acute right-sided thoracic back pain - suspect MSK so try heat and stretch, can try muscle relaxant qhs prn.    Orders Placed This Encounter  Procedures  . Pneumococcal polysaccharide vaccine 23-valent greater than or equal to 2yo subcutaneous/IM  . Lipid panel    Order Specific Question:   Has the patient fasted?    Answer:    Yes  . Comprehensive metabolic panel    Order Specific Question:   Has the patient fasted?    Answer:   Yes  . CBC with Differential/Platelet  . PSA  . Ambulatory referral to Cardiology    Referral Priority:   Routine    Referral Type:   Consultation    Referral Reason:   Specialty Services Required    Referred to Provider:   Burnell Blanks, MD    Requested Specialty:   Cardiology    Number of Visits Requested:   1  . POCT urinalysis dipstick  . IFOBT POC (occult bld, rslt in office)  . EKG 12-Lead    Meds ordered this encounter  Medications  . tizanidine (ZANAFLEX) 2 MG capsule    Sig: Take 1 capsule (2 mg total) by mouth at bedtime. For thoracic back pain followed by 65min of heat then tennis ball pressure    Dispense:  30 capsule    Refill:  0  . Red Yeast Rice Extract 600 MG TABS    Sig: Take 2 tablets (1,200 mg total) by mouth daily.  Marland Kitchen co-enzyme Q-10 30 MG capsule    Sig: Take 1 capsule (30 mg total) by mouth daily.    I personally performed the services described in this documentation, which was scribed in my presence. The recorded information has been reviewed and considered, and addended by me as needed.   Delman Cheadle, M.D.  Primary Care at [facility] [address] [place], [place] [address] [phone] phone [phone] fax  10/09/17 7:40 AM

## 2017-10-06 NOTE — Patient Instructions (Addendum)
IF you received an x-ray today, you will receive an invoice from [facility]. Please contact [facility] at [phone] with questions or concerns regarding your invoice.   IF you received labwork today, you will receive an invoice from [place]. Please contact [facility] at [phone] with questions or concerns regarding your invoice.   Our billing staff will not be able to assist you with questions regarding bills from these companies.  You will be contacted with the lab results as soon as they are available. The fastest way to get your results is to activate your My Chart account. Instructions are located on the last page of this paperwork. If you have not heard from [place] regarding the results in 2 weeks, please contact this office.    This exercise warms up your muscles and joints and improves the movement and flexibility of your back and shoulders. This exercise also help to relieve pain. Exercise A: Chest and spine stretch  1. Lie down on your back on a firm surface. 2. Roll a towel or a small blanket so it is about 4 inches (10 cm) in diameter. 3. Put the towel lengthwise under the middle of your back so it is under your spine, but not under your shoulder blades. 4. To increase the stretch, you may put your hands behind your head and let your elbows fall to your sides. 5. Hold for 30 seconds. Repeat exercise 10 times. Complete this exercise every night following muscle relaxer and 20 minutes of heat (do not need to take the muscle relaxer at all if you prefer - can do just heat and stretching.  Diverticulosis Diverticulosis is a condition that develops when small pouches (diverticula) form in the wall of the large intestine (colon). The colon is where water is absorbed and stool is formed. The pouches form when the inside layer of the colon pushes through weak spots in the outer layers of the colon. You may have a few pouches or many of them. What are the  causes? The cause of this condition is not known. What increases the risk? The following factors may make you more likely to develop this condition:  Being older than age 67. Your risk for this condition increases with age. Diverticulosis is rare among people younger than age 30. By age 23, many people have it.  Eating a low-fiber diet.  Having frequent constipation.  Being overweight.  Not getting enough exercise.  Smoking.  Taking over-the-counter pain medicines, like aspirin and ibuprofen.  Having a family history of diverticulosis.  What are the signs or symptoms? In most people, there are no symptoms of this condition. If you do have symptoms, they may include:  Bloating.  Cramps in the abdomen.  Constipation or diarrhea.  Pain in the lower left side of the abdomen.  How is this diagnosed? This condition is most often diagnosed during an exam for other colon problems. Because diverticulosis usually has no symptoms, it often cannot be diagnosed independently. This condition may be diagnosed by:  Using a flexible scope to examine the colon (colonoscopy).  Taking an X-ray of the colon after dye has been put into the colon (barium enema).  Doing a CT scan.  How is this treated? You may not need treatment for this condition if you have never developed an infection related to diverticulosis. If you have had an infection before, treatment may include:  Eating a high-fiber diet. This may include eating more fruits, vegetables, and grains.  Taking a fiber supplement.  Taking a live bacteria supplement (probiotic).  Taking medicine to relax your colon.  Taking antibiotic medicines.  Follow these instructions at home:  Drink 6-8 glasses of water or more each day to prevent constipation.  Try not to strain when you have a bowel movement.  If you have had an infection before: ? Eat more fiber as directed by your health care provider or your diet and nutrition  specialist (dietitian). ? Take a fiber supplement or probiotic, if your health care provider approves.  Take over-the-counter and prescription medicines only as told by your health care provider.  If you were prescribed an antibiotic, take it as told by your health care provider. Do not stop taking the antibiotic even if you start to feel better.  Keep all follow-up visits as told by your health care provider. This is important. Contact a health care provider if:  You have pain in your abdomen.  You have bloating.  You have cramps.  You have not had a bowel movement in 3 days. Get help right away if:  Your pain gets worse.  Your bloating becomes very bad.  You have a fever or chills, and your symptoms suddenly get worse.  You vomit.  You have bowel movements that are bloody or black.  You have bleeding from your rectum. Summary  Diverticulosis is a condition that develops when small pouches (diverticula) form in the wall of the large intestine (colon).  You may have a few pouches or many of them.  This condition is most often diagnosed during an exam for other colon problems.  If you have had an infection related to diverticulosis, treatment may include increasing the fiber in your diet, taking supplements, or taking medicines. This information is not intended to replace advice given to you by your health care provider. Make sure you discuss any questions you have with your health care provider. Document Released: 07/06/2004 Document Revised: 08/28/2016 Document Reviewed: 08/28/2016 Elsevier Interactive Patient Education  2017 [employer].     Why follow it? Research shows. . Those who follow the Mediterranean diet have a reduced risk of heart disease  . The diet is associated with a reduced incidence of Parkinson's and Alzheimer's diseases . People following the diet may have longer life expectancies and lower rates of chronic diseases  . The Dietary Guidelines for  Americans recommends the Mediterranean diet as an eating plan to promote health and prevent disease  What Is the Mediterranean Diet?  . Healthy eating plan based on typical foods and recipes of Mediterranean-style cooking . The diet is primarily a plant based diet; these foods should make up a majority of meals   Starches - Plant based foods should make up a majority of meals - They are an important sources of vitamins, minerals, energy, antioxidants, and fiber - Choose whole grains, foods high in fiber and minimally processed items  - Typical grain sources include wheat, oats, barley, corn, brown rice, bulgar, farro, millet, polenta, couscous  - Various types of beans include chickpeas, lentils, fava beans, black beans, white beans   Fruits  Veggies - Large quantities of antioxidant rich fruits & veggies; 6 or more servings  - Vegetables can be eaten raw or lightly drizzled with oil and cooked  - Vegetables common to the traditional Mediterranean Diet include: artichokes, arugula, beets, broccoli, brussel sprouts, cabbage, carrots, celery, collard greens, cucumbers, eggplant, kale, leeks, lemons, lettuce, mushrooms, okra, onions, peas, peppers, potatoes, pumpkin, radishes, rutabaga,  shallots, spinach, sweet potatoes, turnips, zucchini - Fruits common to the Mediterranean Diet include: apples, apricots, avocados, cherries, clementines, dates, figs, grapefruits, grapes, melons, nectarines, oranges, peaches, pears, pomegranates, strawberries, tangerines  Fats - Replace butter and margarine with healthy oils, such as olive oil, canola oil, and tahini  - Limit nuts to no more than a handful a day  - Nuts include walnuts, almonds, pecans, pistachios, pine nuts  - Limit or avoid candied, honey roasted or heavily salted nuts - Olives are central to the [employer] - can be eaten whole or used in a variety of dishes   Meats Protein - Limiting red meat: no more than a few times a month - When  eating red meat: choose lean cuts and keep the portion to the size of deck of cards - Eggs: approx. 0 to 4 times a week  - Fish and lean poultry: at least 2 a week  - Healthy protein sources include, chicken, [place], lean beef, lamb - Increase intake of seafood such as tuna, salmon, trout, mackerel, shrimp, scallops - Avoid or limit high fat processed meats such as sausage and bacon  Dairy - Include moderate amounts of low fat dairy products  - Focus on healthy dairy such as fat free yogurt, skim milk, low or reduced fat cheese - Limit dairy products higher in fat such as whole or 2% milk, cheese, ice cream  Alcohol - Moderate amounts of red wine is ok  - No more than 5 oz daily for women (all ages) and men older than age 49  - No more than 10 oz of wine daily for men younger than 59  Other - Limit sweets and other desserts  - Use herbs and spices instead of salt to flavor foods  - Herbs and spices common to the traditional Mediterranean Diet include: basil, bay leaves, chives, cloves, cumin, fennel, garlic, lavender, marjoram, mint, oregano, parsley, pepper, rosemary, sage, savory, sumac, tarragon, thyme   It's not just a diet, it's a lifestyle:  . The Mediterranean diet includes lifestyle factors typical of those in the region  . Foods, drinks and meals are best eaten with others and savored . Daily physical activity is important for overall good health . This could be strenuous exercise like running and aerobics . This could also be more leisurely activities such as walking, housework, yard-work, or taking the stairs . Moderation is the key; a balanced and healthy diet accommodates most foods and drinks . Consider portion sizes and frequency of consumption of certain foods   Meal Ideas & Options:  . Breakfast:  o Whole wheat toast or whole wheat English muffins with peanut butter & hard boiled egg o Steel cut oats topped with apples & cinnamon and skim milk  o Fresh fruit: banana,  strawberries, melon, berries, peaches  o Smoothies: strawberries, bananas, greek yogurt, peanut butter o Low fat greek yogurt with blueberries and granola  o Egg white omelet with spinach and mushrooms o Breakfast couscous: whole wheat couscous, apricots, skim milk, cranberries  . Sandwiches:  o Hummus and grilled vegetables (peppers, zucchini, squash) on whole wheat bread   o Grilled chicken on whole wheat pita with lettuce, tomatoes, cucumbers or tzatziki  o Tuna salad on whole wheat bread: tuna salad made with greek yogurt, olives, red peppers, capers, green onions o Garlic rosemary lamb pita: lamb sauted with garlic, rosemary, salt & pepper; add lettuce, cucumber, greek yogurt to pita - flavor with lemon juice and black pepper  .  Seafood:  o Mediterranean grilled salmon, seasoned with garlic, basil, parsley, lemon juice and black pepper o Shrimp, lemon, and spinach whole-grain pasta salad made with low fat greek yogurt  o Seared scallops with lemon orzo  o Seared tuna steaks seasoned salt, pepper, coriander topped with tomato mixture of olives, tomatoes, olive oil, minced garlic, parsley, green onions and cappers  . Meats:  o Herbed greek chicken salad with kalamata olives, cucumber, feta  o Red bell peppers stuffed with spinach, bulgur, lean ground beef (or lentils) & topped with feta   o Kebabs: skewers of chicken, tomatoes, onions, zucchini, squash  o [place] burgers: made with red onions, mint, dill, lemon juice, feta cheese topped with roasted red peppers . Vegetarian o Cucumber salad: cucumbers, artichoke hearts, celery, red onion, feta cheese, tossed in olive oil & lemon juice  o Hummus and whole grain pita points with a greek salad (lettuce, tomato, feta, olives, cucumbers, red onion) o Lentil soup with celery, carrots made with vegetable broth, garlic, salt and pepper  o Tabouli salad: parsley, bulgur, mint, scallions, cucumbers, tomato, radishes, lemon juice, olive oil, salt and  pepper.     Mediterranean Diet A Mediterranean diet refers to food and lifestyle choices that are based on the traditions of countries located on the [employer]. This way of eating has been shown to help prevent certain conditions and improve outcomes for people who have chronic diseases, like kidney disease and heart disease. What are tips for following this plan? Lifestyle  Cook and eat meals together with your family, when possible.  Drink enough fluid to keep your urine clear or pale yellow.  Be physically active every day. This includes: ? Aerobic exercise like running or swimming. ? Leisure activities like gardening, walking, or housework.  Get 7-8 hours of sleep each night.  If recommended by your health care provider, drink red wine in moderation. This means 1 glass a day for nonpregnant women and 2 glasses a day for men. A glass of wine equals 5 oz (150 mL). Reading food labels  Check the serving size of packaged foods. For foods such as rice and pasta, the serving size refers to the amount of cooked product, not dry.  Check the total fat in packaged foods. Avoid foods that have saturated fat or trans fats.  Check the ingredients list for added sugars, such as corn syrup. Shopping  At the grocery store, buy most of your food from the areas near the walls of the store. This includes: ? Fresh fruits and vegetables (produce). ? Grains, beans, nuts, and seeds. Some of these may be available in unpackaged forms or large amounts (in bulk). ? Fresh seafood. ? Poultry and eggs. ? Low-fat dairy products.  Buy whole ingredients instead of prepackaged foods.  Buy fresh fruits and vegetables in-season from local farmers markets.  Buy frozen fruits and vegetables in resealable bags.  If you do not have access to quality fresh seafood, buy precooked frozen shrimp or canned fish, such as tuna, salmon, or sardines.  Buy small amounts of raw or cooked vegetables, salads, or  olives from the deli or salad bar at your store.  Stock your pantry so you always have certain foods on hand, such as olive oil, canned tuna, canned tomatoes, rice, pasta, and beans. Cooking  Cook foods with extra-virgin olive oil instead of using butter or other vegetable oils.  Have meat as a side dish, and have vegetables or grains as your main dish. This  means having meat in small portions or adding small amounts of meat to foods like pasta or stew.  Use beans or vegetables instead of meat in common dishes like chili or lasagna.  Experiment with different cooking methods. Try roasting or broiling vegetables instead of steaming or sauteing them.  Add frozen vegetables to soups, stews, pasta, or rice.  Add nuts or seeds for added healthy fat at each meal. You can add these to yogurt, salads, or vegetable dishes.  Marinate fish or vegetables using olive oil, lemon juice, garlic, and fresh herbs. Meal planning  Plan to eat 1 vegetarian meal one day each week. Try to work up to 2 vegetarian meals, if possible.  Eat seafood 2 or more times a week.  Have healthy snacks readily available, such as: ? Vegetable sticks with hummus. ? [place] yogurt. ? Fruit and nut trail mix.  Eat balanced meals throughout the week. This includes: ? Fruit: 2-3 servings a day ? Vegetables: 4-5 servings a day ? Low-fat dairy: 2 servings a day ? Fish, poultry, or lean meat: 1 serving a day ? Beans and legumes: 2 or more servings a week ? Nuts and seeds: 1-2 servings a day ? Whole grains: 6-8 servings a day ? Extra-virgin olive oil: 3-4 servings a day  Limit red meat and sweets to only a few servings a month What are my food choices?  Mediterranean diet ? Recommended ? Grains: Whole-grain pasta. Brown rice. Bulgar wheat. Polenta. Couscous. Whole-wheat bread. [place]. ? Vegetables: Artichokes. Beets. Broccoli. Cabbage. Carrots. Eggplant. Green beans. Chard. Kale. Spinach. Onions. Leeks. Peas.  Squash. Tomatoes. Peppers. Radishes. ? Fruits: Apples. Apricots. Avocado. Berries. Bananas. Cherries. Dates. Figs. Grapes. Lemons. Melon. Oranges. Peaches. Plums. Pomegranate. ? Meats and other protein foods: Beans. Almonds. Sunflower seeds. Pine nuts. Peanuts. [place]. Salmon. Scallops. Shrimp. [place]. Tilapia. Clams. Oysters. Eggs. ? Dairy: Low-fat milk. Cheese. Greek yogurt. ? Beverages: Water. Red wine. Herbal tea. ? Fats and oils: Extra virgin olive oil. Avocado oil. Grape seed oil. ? Sweets and desserts: [place] yogurt with honey. Baked apples. Poached pears. Trail mix. ? Seasoning and other foods: Basil. Cilantro. Coriander. Cumin. Mint. Parsley. Sage. Rosemary. Tarragon. Garlic. Oregano. Thyme. Pepper. Balsalmic vinegar. Tahini. Hummus. Tomato sauce. Olives. Mushrooms. ? Limit these ? Grains: Prepackaged pasta or rice dishes. Prepackaged cereal with added sugar. ? Vegetables: Deep fried potatoes (french fries). ? Fruits: Fruit canned in syrup. ? Meats and other protein foods: Beef. Pork. Lamb. Poultry with skin. Hot dogs. [place] Salines. ? Dairy: Ice cream. Sour cream. Whole milk. ? Beverages: Juice. Sugar-sweetened soft drinks. Beer. Liquor and spirits. ? Fats and oils: Butter. Canola oil. Vegetable oil. Beef fat (tallow). Lard. ? Sweets and desserts: Cookies. Cakes. Pies. Candy. ? Seasoning and other foods: Mayonnaise. Premade sauces and marinades. ? The items listed may not be a complete list. Talk with your dietitian about what dietary choices are right for you. Summary  The Mediterranean diet includes both food and lifestyle choices.  Eat a variety of fresh fruits and vegetables, beans, nuts, seeds, and whole grains.  Limit the amount of red meat and sweets that you eat.  Talk with your health care provider about whether it is safe for you to drink red wine in moderation. This means 1 glass a day for nonpregnant women and 2 glasses a day for men. A glass of wine equals 5 oz (150 mL). This  information is not intended to replace advice given to you by your health care provider. Make  sure you discuss any questions you have with your health care provider. Document Released: 06/01/2016 Document Revised: 07/04/2016 Document Reviewed: 06/01/2016 Elsevier Interactive Patient Education  2018 [place] Maintenance, Male A healthy lifestyle and preventive care is important for your health and wellness. Ask your health care provider about what schedule of regular examinations is right for you. What should I know about weight and diet? Eat a Healthy Diet  Eat plenty of vegetables, fruits, whole grains, low-fat dairy products, and lean protein.  Do not eat a lot of foods high in solid fats, added sugars, or salt.  Maintain a Healthy Weight Regular exercise can help you achieve or maintain a healthy weight. You should:  Do at least 150 minutes of exercise each week. The exercise should increase your heart rate and make you sweat (moderate-intensity exercise).  Do strength-training exercises at least twice a week.  Watch Your Levels of Cholesterol and Blood Lipids  Have your blood tested for lipids and cholesterol every 5 years starting at 66 years of age. If you are at high risk for heart disease, you should start having your blood tested when you are 66 years old. You may need to have your cholesterol levels checked more often if: ? Your lipid or cholesterol levels are high. ? You are older than 66 years of age. ? You are at high risk for heart disease.  What should I know about cancer screening? Many types of cancers can be detected early and may often be prevented. Lung Cancer  You should be screened every year for lung cancer if: ? You are a current smoker who has smoked for at least 30 years. ? You are a former smoker who has quit within the past 15 years.  Talk to your health care provider about your screening options, when you should start screening, and how  often you should be screened.  Colorectal Cancer  Routine colorectal cancer screening usually begins at 66 years of age and should be repeated every 5-10 years until you are 66 years old. You may need to be screened more often if early forms of precancerous polyps or small growths are found. Your health care provider may recommend screening at an earlier age if you have risk factors for colon cancer.  Your health care provider may recommend using home test kits to check for hidden blood in the stool.  A small camera at the end of a tube can be used to examine your colon (sigmoidoscopy or colonoscopy). This checks for the earliest forms of colorectal cancer.  Prostate and Testicular Cancer  Depending on your age and overall health, your health care provider may do certain tests to screen for prostate and testicular cancer.  Talk to your health care provider about any symptoms or concerns you have about testicular or prostate cancer.  Skin Cancer  Check your skin from head to toe regularly.  Tell your health care provider about any new moles or changes in moles, especially if: ? There is a change in a mole's size, shape, or color. ? You have a mole that is larger than a pencil eraser.  Always use sunscreen. Apply sunscreen liberally and repeat throughout the day.  Protect yourself by wearing long sleeves, pants, a wide-brimmed hat, and sunglasses when outside.  What should I know about heart disease, diabetes, and high blood pressure?  If you are 61-48 years of age, have your blood pressure checked every 3-5 years. If you are 40 years  of age or older, have your blood pressure checked every year. You should have your blood pressure measured twice-once when you are at a hospital or clinic, and once when you are not at a hospital or clinic. Record the average of the two measurements. To check your blood pressure when you are not at a hospital or clinic, you can use: ? An automated blood  pressure machine at a pharmacy. ? A home blood pressure monitor.  Talk to your health care provider about your target blood pressure.  If you are between 18-69 years old, ask your health care provider if you should take aspirin to prevent heart disease.  Have regular diabetes screenings by checking your fasting blood sugar level. ? If you are at a normal weight and have a low risk for diabetes, have this test once every three years after the age of 35. ? If you are overweight and have a high risk for diabetes, consider being tested at a younger age or more often.  A one-time screening for abdominal aortic aneurysm (AAA) by ultrasound is recommended for men aged 46-75 years who are current or former smokers. What should I know about preventing infection? Hepatitis B If you have a higher risk for hepatitis B, you should be screened for this virus. Talk with your health care provider to find out if you are at risk for hepatitis B infection. Hepatitis C Blood testing is recommended for:  Everyone born from [birth year] through 1965.  Anyone with known risk factors for hepatitis C.  Sexually Transmitted Diseases (STDs)  You should be screened each year for STDs including gonorrhea and chlamydia if: ? You are sexually active and are younger than 66 years of age. ? You are older than 66 years of age and your health care provider tells you that you are at risk for this type of infection. ? Your sexual activity has changed since you were last screened and you are at an increased risk for chlamydia or gonorrhea. Ask your health care provider if you are at risk.  Talk with your health care provider about whether you are at high risk of being infected with HIV. Your health care provider may recommend a prescription medicine to help prevent HIV infection.  What else can I do?  Schedule regular health, dental, and eye exams.  Stay current with your vaccines (immunizations).  Do not use any tobacco  products, such as cigarettes, chewing tobacco, and e-cigarettes. If you need help quitting, ask your health care provider.  Limit alcohol intake to no more than 2 drinks per day. One drink equals 12 ounces of beer, 5 ounces of wine, or 1 ounces of hard liquor.  Do not use street drugs.  Do not share needles.  Ask your health care provider for help if you need support or information about quitting drugs.  Tell your health care provider if you often feel depressed.  Tell your health care provider if you have ever been abused or do not feel safe at home. This information is not intended to replace advice given to you by your health care provider. Make sure you discuss any questions you have with your health care provider. Document Released: 04/06/2008 Document Revised: 06/07/2016 Document Reviewed: 07/13/2015 Elsevier Interactive Patient Education  [employer].

## 2017-10-07 LAB — COMPREHENSIVE METABOLIC PANEL
ALT: 24 IU/L (ref 0–44)
AST: 25 IU/L (ref 0–40)
Albumin/Globulin Ratio: 1.4 (ref 1.2–2.2)
Albumin: 4.8 g/dL (ref 3.6–4.8)
Alkaline Phosphatase: 68 IU/L (ref 39–117)
BUN/Creatinine Ratio: 21 (ref 10–24)
BUN: 21 mg/dL (ref 8–27)
Bilirubin Total: 0.6 mg/dL (ref 0.0–1.2)
CO2: 22 mmol/L (ref 20–29)
Calcium: 9.7 mg/dL (ref 8.6–10.2)
Chloride: 101 mmol/L (ref 96–106)
Creatinine, Ser: 0.99 mg/dL (ref 0.76–1.27)
GFR calc Af Amer: 91 mL/min/{1.73_m2} (ref >59)
GFR calc non Af Amer: 79 mL/min/{1.73_m2} (ref >59)
Globulin, Total: 3.4 g/dL (ref 1.5–4.5)
Glucose: 103 mg/dL — ABNORMAL HIGH (ref 65–99)
Potassium: 4.5 mmol/L (ref 3.5–5.2)
Sodium: 139 mmol/L (ref 134–144)
Total Protein: 8.2 g/dL (ref 6.0–8.5)

## 2017-10-07 LAB — CBC WITH DIFFERENTIAL/PLATELET
Basophils Absolute: 0 10*3/uL (ref 0.0–0.2)
Basos: 0 %
EOS (ABSOLUTE): 0.1 10*3/uL (ref 0.0–0.4)
Eos: 1 %
Hematocrit: 44 % (ref 37.5–51.0)
Hemoglobin: 15 g/dL (ref 13.0–17.7)
Immature Grans (Abs): 0 10*3/uL (ref 0.0–0.1)
Immature Granulocytes: 0 %
Lymphocytes Absolute: 1.6 10*3/uL (ref 0.7–3.1)
Lymphs: 31 %
MCH: 33.6 pg — ABNORMAL HIGH (ref 26.6–33.0)
MCHC: 34.1 g/dL (ref 31.5–35.7)
MCV: 98 fL — ABNORMAL HIGH (ref 79–97)
Monocytes Absolute: 0.5 10*3/uL (ref 0.1–0.9)
Monocytes: 10 %
Neutrophils Absolute: 3 10*3/uL (ref 1.4–7.0)
Neutrophils: 58 %
Platelets: 289 10*3/uL (ref 150–379)
RBC: 4.47 x10E6/uL (ref 4.14–5.80)
RDW: 13.3 % (ref 12.3–15.4)
WBC: 5.2 10*3/uL (ref 3.4–10.8)

## 2017-10-07 LAB — LIPID PANEL
Chol/HDL Ratio: 4 ratio (ref 0.0–5.0)
Cholesterol, Total: 238 mg/dL — ABNORMAL HIGH (ref 100–199)
HDL: 59 mg/dL (ref >39)
LDL Calculated: 135 mg/dL — ABNORMAL HIGH (ref 0–99)
Triglycerides: 222 mg/dL — ABNORMAL HIGH (ref 0–149)
VLDL Cholesterol Cal: 44 mg/dL — ABNORMAL HIGH (ref 5–40)

## 2017-10-07 LAB — PSA: Prostate Specific Ag, Serum: 1.1 ng/mL (ref 0.0–4.0)

## 2017-10-12 ENCOUNTER — Encounter: Payer: Self-pay | Admitting: Family Medicine

## 2017-10-21 ENCOUNTER — Encounter: Payer: Self-pay | Admitting: Family Medicine

## 2017-10-21 MED ORDER — PRAVASTATIN SODIUM 20 MG PO TABS: 20.0000 mg | ORAL_TABLET | Freq: Every day | ORAL | 1 refills | Status: DC

## 2017-10-21 NOTE — Addendum Note (Signed)
Addended by: Shawnee Knapp on: 10/21/2017 11:16 AM   Modules accepted: Orders

## 2017-10-23 DIAGNOSIS — H269 Unspecified cataract: Secondary | ICD-10-CM

## 2017-10-23 HISTORY — DX: Unspecified cataract: H26.9

## 2017-10-30 ENCOUNTER — Telehealth: Payer: Self-pay

## 2017-10-30 MED ORDER — TIZANIDINE HCL 2 MG PO CAPS: 2.0000 mg | ORAL_CAPSULE | Freq: Every day | ORAL | 1 refills | Status: DC

## 2017-10-30 NOTE — Telephone Encounter (Signed)
Refill sent in but not a 90d supply as not meant to be a chronic med. If still having sig pain in another month, should come back in for recheck - may need xray, PT, or different med regimen.

## 2017-10-30 NOTE — Telephone Encounter (Signed)
Pt requesting refill on Tizanidine 2 mg, 90 day supply, last see 10/06/2017, next visit scheduled for 10/07/2018 Please advise

## 2017-11-01 NOTE — Telephone Encounter (Signed)
Pt stated that was the wrong medication he needs the refill on the pravastatin, to be sent to [facility] 90 day supply

## 2017-11-05 MED ORDER — PRAVASTATIN SODIUM 20 MG PO TABS: 20.0000 mg | ORAL_TABLET | Freq: Every day | ORAL | 1 refills | Status: DC

## 2017-11-05 NOTE — Addendum Note (Signed)
Addended by: Shawnee Knapp on: 11/05/2017 08:09 AM   Modules accepted: Orders

## 2017-11-05 NOTE — Telephone Encounter (Signed)
Had already sent this in to [facility]. Resent to [facility]. Recheck in 4 mos with fasting labs and ALT

## 2017-12-16 DIAGNOSIS — H524 Presbyopia: Secondary | ICD-10-CM | POA: Diagnosis not present

## 2017-12-24 ENCOUNTER — Encounter: Payer: Self-pay | Admitting: Cardiovascular Disease

## 2017-12-24 ENCOUNTER — Ambulatory Visit: Payer: Medicare HMO | Admitting: Cardiovascular Disease

## 2017-12-24 VITALS — BP 122/80 | HR 65 | Ht 73.0 in | Wt 178.4 lb

## 2017-12-24 DIAGNOSIS — E7800 Pure hypercholesterolemia, unspecified: Secondary | ICD-10-CM | POA: Diagnosis not present

## 2017-12-24 DIAGNOSIS — I2510 Atherosclerotic heart disease of native coronary artery without angina pectoris: Secondary | ICD-10-CM | POA: Diagnosis not present

## 2017-12-24 LAB — LIPID PANEL
CHOLESTEROL TOTAL: 242 mg/dL — AB (ref 100–199)
Chol/HDL Ratio: 5 ratio (ref 0.0–5.0)
HDL: 48 mg/dL (ref >39)
LDL CALC: 131 mg/dL — AB (ref 0–99)
TRIGLYCERIDES: 313 mg/dL — AB (ref 0–149)
VLDL CHOLESTEROL CAL: 63 mg/dL — AB (ref 5–40)

## 2017-12-24 LAB — HEPATIC FUNCTION PANEL
ALK PHOS: 73 IU/L (ref 39–117)
ALT: 31 IU/L (ref 0–44)
AST: 28 IU/L (ref 0–40)
Albumin: 4.6 g/dL (ref 3.6–4.8)
Bilirubin Total: 0.5 mg/dL (ref 0.0–1.2)
Bilirubin, Direct: 0.13 mg/dL (ref 0.00–0.40)
Total Protein: 7.7 g/dL (ref 6.0–8.5)

## 2017-12-24 MED ORDER — ASPIRIN EC 81 MG PO TBEC: 81.0000 mg | DELAYED_RELEASE_TABLET | Freq: Every day | ORAL | 3 refills | Status: AC

## 2017-12-24 NOTE — Progress Notes (Signed)
Chief Complaint  Patient presents with  . New Patient (Initial Visit)   History of Present Illness: 67 yo male with history of CAD, hyperlipidemia who is here today as a new consult, referred by Dr. Delman Cheadle, for evaluation of his CAD. He had a cardiac catheterization in 2002 with findings of a 20% ostial LAD stenosis, 50% ostial Circumflex stenosis, 50% distal Circumflex stenosis, 30% intermediate branch stenosis. He was not followed in cardiology following that cath. EKG December 2018 with sinus bradycardia, rate 54 bpm. He was started on Pravastatin December 2018 with LDL of 135 at that time. He has had no chest pain or dyspnea. He is very active. He is retired. No LE edema, dizziness, near syncope or syncope.   Primary Care Physician: Shawnee Knapp, MD  Past Medical History:  Diagnosis Date  . CAD (coronary artery disease)    Moderate Circumflex stenosis by cath in 2002  . Cyst in hand    "fluid coming out of joints in fingers"; trying fish oil  . Hx of cardiac cath 2002   negative  . Hyperlipemia   . Kidney stones     Past Surgical History:  Procedure Laterality Date  . kidney stone removal    . SPINE SURGERY  11/2007   ruptured disc  . VASECTOMY  1977  . WISDOM TOOTH EXTRACTION      Current Outpatient Medications  Medication Sig Dispense Refill  . b complex vitamins tablet Take 1 tablet by mouth daily.    . Multiple Vitamin (MULTIVITAMIN) tablet Take 1 tablet by mouth daily.    . pravastatin (PRAVACHOL) 20 MG tablet Take 1 tablet (20 mg total) by mouth daily. 90 tablet 1  . aspirin EC 81 MG tablet Take 1 tablet (81 mg total) by mouth daily. 90 tablet 3   No current facility-administered medications for this visit.     No Known Allergies  Social History   Socioeconomic History  . Marital status: Married    Spouse name: Not on file  . Number of children: Not on file  . Years of education: Not on file  . Highest education level: Not on file  Social Needs  .  Financial resource strain: Not on file  . Food insecurity - worry: Not on file  . Food insecurity - inability: Not on file  . Transportation needs - medical: Not on file  . Transportation needs - non-medical: Not on file  Occupational History  . 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 Smoking status: Former Smoker    Packs/day: 1.50    Years: 15.00    Pack years: 22.50    Types: Cigarettes    Last attempt to quit: 1995    Years since quitting: 24.1  . Smokeless tobacco: Never Used  . Tobacco comment: quit around 1997  Substance and Sexual Activity  . Alcohol use: Yes    Alcohol/week: 4.2 oz    Types: 7 Cans of beer per week    Comment: social  . Drug use: No  . Sexual activity: Yes    Partners: Female  Other Topics Concern  . Not on file  Social History Narrative   Married. Education:High School/Other.    Family History  Problem Relation Age of Onset  . Heart disease Father        heart attack  . COPD Daughter   . Diabetes Maternal Grandmother   .  Stroke Brother   . Colon cancer Neg Hx     Review of Systems:  As stated in the HPI and otherwise negative.   BP 122/80 (BP Location: Right Arm, Patient Position: Sitting, Cuff Size: Normal)   Pulse 65   Ht 6\' 1"  (1.854 m)   Wt 178 lb 6.4 oz (80.9 kg)   SpO2 98%   BMI 23.54 kg/m   Physical Examination: General: Well developed, well nourished, NAD  HEENT: OP clear, mucus membranes moist  SKIN: warm, dry. No rashes. Neuro: No focal deficits  Musculoskeletal: Muscle strength 5/5 all ext  Psychiatric: Mood and affect normal  Neck: No JVD, no carotid bruits, no thyromegaly, no lymphadenopathy.  Lungs:Clear bilaterally, no wheezes, rhonci, crackles Cardiovascular: Regular rate and rhythm. No murmurs, gallops or rubs. Abdomen:Soft. Bowel sounds present. Non-tender.  Extremities: No lower extremity edema. Pulses are 2 + in the bilateral DP/PT.  EKG:  EKG  is not ordered today. The ekg from December 2018 is reviewed by me and shows sinus brady, rate 54 bpm  Recent Labs: 10/06/2017: ALT 24; BUN 21; Creatinine, Ser 0.99; Hemoglobin 15.0; Platelets 289; Potassium 4.5; Sodium 139   Lipid Panel    Component Value Date/Time   CHOL 238 (H) 10/06/2017 0919   TRIG 222 (H) 10/06/2017 0919   HDL 59 10/06/2017 0919   CHOLHDL 4.0 10/06/2017 0919   CHOLHDL 3.5 03/23/2016 1108   VLDL 26 03/23/2016 1108   LDLCALC 135 (H) 10/06/2017 0919     Wt Readings from Last 3 Encounters:  12/24/17 178 lb 6.4 oz (80.9 kg)  10/06/17 179 lb 3.2 oz (81.3 kg)  09/28/16 178 lb 6.4 oz (80.9 kg)     Other studies Reviewed: Additional studies/ records that were reviewed today include:  Review of the above records demonstrates:    Assessment and Plan:   1. CAD without angina: He has mild to moderate CAD noted on cath in 2002. No follow up since then in our office. He has recently been started on Pravastatin. I will check lipids and LFTs today.  I will start ASA 81 mg daily. I will arrange an echo to assess LV function. I will arrange an exercise stress test to exclude ischemia.   2. Hyperlipidemia: he is on a statin. We will check lipids and LFTs today.   Current medicines are reviewed at length with the patient today.  The patient does not have concerns regarding medicines.  The following changes have been made:  no change  Labs/ tests ordered today include:   Orders Placed This Encounter  Procedures  . Lipid Profile  . Hepatic function panel  . EXERCISE TOLERANCE TEST (ETT)  . ECHOCARDIOGRAM COMPLETE     Disposition:   FU with me in one year.    Signed, Lauree Chandler, MD 12/24/2017 9:09 AM    [facility] [place], [place]  [address] Phone: [phone]; Fax: [phone]

## 2017-12-24 NOTE — Patient Instructions (Signed)
Medication Instructions:  Your physician has recommended you make the following change in your medication:  Start aspirin 81 mg by mouth daily.    Labwork: Lab work to be done today--Lipid and Liver profiles  Testing/Procedures: Your physician has requested that you have an exercise tolerance test. For further information please visit [email or web address]. Please also follow instruction sheet, as given.  Your physician has requested that you have an echocardiogram. Echocardiography is a painless test that uses sound waves to create images of your heart. It provides your doctor with information about the size and shape of your heart and how well your heart's chambers and valves are working. This procedure takes approximately one hour. There are no restrictions for this procedure.    Follow-Up: Your physician recommends that you schedule a follow-up appointment in: 12 months. Please call our office in about 9 months to schedule this appointment    Any Other Special Instructions Will Be Listed Below (If Applicable).     If you need a refill on your cardiac medications before your next appointment, please call your pharmacy.

## 2017-12-27 ENCOUNTER — Other Ambulatory Visit: Payer: Self-pay | Admitting: *Deleted

## 2017-12-27 DIAGNOSIS — E785 Hyperlipidemia, unspecified: Secondary | ICD-10-CM

## 2017-12-27 MED ORDER — ROSUVASTATIN CALCIUM 20 MG PO TABS: 20.0000 mg | ORAL_TABLET | Freq: Every day | ORAL | 3 refills | Status: DC

## 2018-01-03 ENCOUNTER — Ambulatory Visit (INDEPENDENT_AMBULATORY_CARE_PROVIDER_SITE_OTHER): Payer: Medicare HMO

## 2018-01-03 ENCOUNTER — Other Ambulatory Visit: Payer: Self-pay

## 2018-01-03 ENCOUNTER — Ambulatory Visit (HOSPITAL_COMMUNITY): Payer: Medicare HMO | Attending: Cardiovascular Disease

## 2018-01-03 DIAGNOSIS — Z8249 Family history of ischemic heart disease and other diseases of the circulatory system: Secondary | ICD-10-CM | POA: Insufficient documentation

## 2018-01-03 DIAGNOSIS — Z87891 Personal history of nicotine dependence: Secondary | ICD-10-CM | POA: Diagnosis not present

## 2018-01-03 DIAGNOSIS — E785 Hyperlipidemia, unspecified: Secondary | ICD-10-CM | POA: Insufficient documentation

## 2018-01-03 DIAGNOSIS — I2510 Atherosclerotic heart disease of native coronary artery without angina pectoris: Secondary | ICD-10-CM

## 2018-01-03 LAB — EXERCISE TOLERANCE TEST
CHL CUP MPHR: 154 {beats}/min
CHL CUP RESTING HR STRESS: 54 {beats}/min
CSEPEDS: 59 s
CSEPEW: 6.9 METS
CSEPHR: 97 %
CSEPPHR: 150 {beats}/min
Exercise duration (min): 4 min
RPE: 17

## 2018-01-04 ENCOUNTER — Telehealth: Payer: Self-pay | Admitting: *Deleted

## 2018-01-04 ENCOUNTER — Encounter: Payer: Self-pay | Admitting: *Deleted

## 2018-01-04 DIAGNOSIS — I2510 Atherosclerotic heart disease of native coronary artery without angina pectoris: Secondary | ICD-10-CM

## 2018-01-04 DIAGNOSIS — R9431 Abnormal electrocardiogram [ECG] [EKG]: Secondary | ICD-10-CM

## 2018-01-04 NOTE — Telephone Encounter (Signed)
-----   Message from Burnell Blanks, MD sent at 01/04/2018  7:39 AM EDT ----- He had mild EKG changes with exercise and no chest pain. Can we arrange an exercise nuclear stress test? cdm

## 2018-01-04 NOTE — Telephone Encounter (Signed)
I spoke with pt and reviewed echo and stress test results with him.  He would like to proceed with exercise nuclear stress test.  I verbally went over instructions with pt and will send copy to him through my chart.  Stress test scheduled for March 25,2019 at 7:30

## 2018-01-10 ENCOUNTER — Telehealth (HOSPITAL_COMMUNITY): Payer: Self-pay | Admitting: *Deleted

## 2018-01-10 NOTE — Telephone Encounter (Signed)
Patient given detailed instructions per Myocardial Perfusion Study Information Sheet for the test on 01/14/18. Patient notified to arrive 15 minutes early and that it is imperative to arrive on time for appointment to keep from having the test rescheduled.  If you need to cancel or reschedule your appointment, please call the office within 24 hours of your appointment. . Patient verbalized understanding. Kirstie Peri

## 2018-01-14 ENCOUNTER — Ambulatory Visit (HOSPITAL_COMMUNITY): Payer: Medicare HMO | Attending: Cardiovascular Disease

## 2018-01-14 DIAGNOSIS — I2510 Atherosclerotic heart disease of native coronary artery without angina pectoris: Secondary | ICD-10-CM

## 2018-01-14 DIAGNOSIS — R9439 Abnormal result of other cardiovascular function study: Secondary | ICD-10-CM | POA: Diagnosis not present

## 2018-01-14 DIAGNOSIS — R9431 Abnormal electrocardiogram [ECG] [EKG]: Secondary | ICD-10-CM | POA: Diagnosis not present

## 2018-01-14 LAB — MYOCARDIAL PERFUSION IMAGING
CHL CUP NUCLEAR SRS: 8
CHL CUP RESTING HR STRESS: 56 {beats}/min
CSEPEDS: 0 s
CSEPHR: 98 %
Estimated workload: 10.1 METS
Exercise duration (min): 8 min
LVDIAVOL: 108 mL (ref 62–150)
LVSYSVOL: 48 mL
MPHR: 154 {beats}/min
Peak HR: 150 {beats}/min
RATE: 0.33
SDS: 0
SSS: 8
TID: 0.97

## 2018-01-14 MED ORDER — TECHNETIUM TC 99M TETROFOSMIN IV KIT
10.2000 | PACK | Freq: Once | INTRAVENOUS | Status: AC | PRN
  Administered 2018-01-14: 10.2 via INTRAVENOUS
  Filled 2018-01-14: qty 11

## 2018-01-14 MED ORDER — TECHNETIUM TC 99M TETROFOSMIN IV KIT
31.4000 | PACK | Freq: Once | INTRAVENOUS | Status: AC | PRN
  Administered 2018-01-14: 31.4 via INTRAVENOUS
  Filled 2018-01-14: qty 32

## 2018-03-01 DIAGNOSIS — H2513 Age-related nuclear cataract, bilateral: Secondary | ICD-10-CM | POA: Diagnosis not present

## 2018-03-01 DIAGNOSIS — H18413 Arcus senilis, bilateral: Secondary | ICD-10-CM | POA: Diagnosis not present

## 2018-03-01 DIAGNOSIS — H40003 Preglaucoma, unspecified, bilateral: Secondary | ICD-10-CM | POA: Diagnosis not present

## 2018-03-01 DIAGNOSIS — H2512 Age-related nuclear cataract, left eye: Secondary | ICD-10-CM | POA: Diagnosis not present

## 2018-03-21 ENCOUNTER — Other Ambulatory Visit: Payer: Medicare HMO | Admitting: *Deleted

## 2018-03-21 DIAGNOSIS — E785 Hyperlipidemia, unspecified: Secondary | ICD-10-CM

## 2018-03-21 LAB — HEPATIC FUNCTION PANEL
ALBUMIN: 4.5 g/dL (ref 3.6–4.8)
ALK PHOS: 69 IU/L (ref 39–117)
ALT: 24 IU/L (ref 0–44)
AST: 25 IU/L (ref 0–40)
BILIRUBIN, DIRECT: 0.15 mg/dL (ref 0.00–0.40)
Bilirubin Total: 0.5 mg/dL (ref 0.0–1.2)
TOTAL PROTEIN: 7.1 g/dL (ref 6.0–8.5)

## 2018-03-21 LAB — LIPID PANEL
Chol/HDL Ratio: 2.6 ratio (ref 0.0–5.0)
Cholesterol, Total: 155 mg/dL (ref 100–199)
HDL: 59 mg/dL (ref >39)
LDL Calculated: 68 mg/dL (ref 0–99)
Triglycerides: 139 mg/dL (ref 0–149)
VLDL Cholesterol Cal: 28 mg/dL (ref 5–40)

## 2018-04-15 DIAGNOSIS — H2513 Age-related nuclear cataract, bilateral: Secondary | ICD-10-CM | POA: Diagnosis not present

## 2018-04-15 DIAGNOSIS — H2512 Age-related nuclear cataract, left eye: Secondary | ICD-10-CM | POA: Diagnosis not present

## 2018-04-16 DIAGNOSIS — H2511 Age-related nuclear cataract, right eye: Secondary | ICD-10-CM | POA: Diagnosis not present

## 2018-05-06 DIAGNOSIS — H2511 Age-related nuclear cataract, right eye: Secondary | ICD-10-CM | POA: Diagnosis not present

## 2018-05-06 DIAGNOSIS — H2513 Age-related nuclear cataract, bilateral: Secondary | ICD-10-CM | POA: Diagnosis not present

## 2018-06-28 ENCOUNTER — Encounter: Payer: Self-pay | Admitting: Family Medicine

## 2018-08-23 IMAGING — NM NM MISC PROCEDURE
5 series · 30 of 30 positions shown · non-contrast
Comparison: none

[Series 1: wbr_r-proj_st rest · 6.51mm/px · 6 of 64 frames shown]
[frame 6/64]
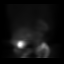
[frame 16/64]
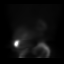
[frame 27/64]
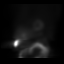
[frame 38/64]
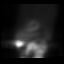
[frame 48/64]
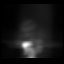
[frame 59/64]
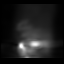

[Series 1: rest · 6.51mm/px · 6 of 64 frames shown]
[frame 6/64]
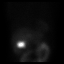
[frame 16/64]
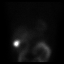
[frame 27/64]
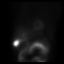
[frame 38/64]
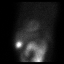
[frame 48/64]
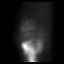
[frame 59/64]
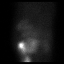

[Series 2: stress - gated · 6.51mm/px · 6 of 512 frames shown]
[frame 43/512]
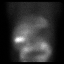
[frame 128/512]
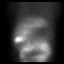
[frame 214/512]
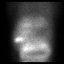
[frame 299/512]
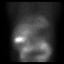
[frame 384/512]
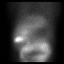
[frame 470/512]
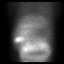

[Series 2: wbr_s-proj_st stress - gated · 6.51mm/px · 6 of 512 frames shown]
[frame 43/512]
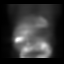
[frame 128/512]
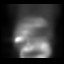
[frame 214/512]
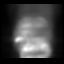
[frame 299/512]
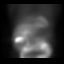
[frame 384/512]
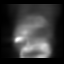
[frame 470/512]
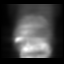

[Series 3: stress - perfusion · 6.51mm/px · 6 of 64 frames shown]
[frame 6/64]
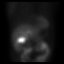
[frame 16/64]
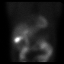
[frame 27/64]
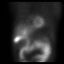
[frame 38/64]
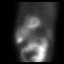
[frame 48/64]
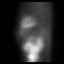
[frame 59/64]
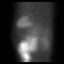

[30 of 30 positions shown; findings below may reference images not displayed]

Canned report from images found in remote index.

Refer to host system for actual result text.

## 2018-10-07 ENCOUNTER — Encounter: Payer: Self-pay | Admitting: Family Medicine

## 2018-10-07 ENCOUNTER — Other Ambulatory Visit: Payer: Self-pay

## 2018-10-07 ENCOUNTER — Ambulatory Visit (INDEPENDENT_AMBULATORY_CARE_PROVIDER_SITE_OTHER): Payer: Medicare HMO | Admitting: Family Medicine

## 2018-10-07 ENCOUNTER — Ambulatory Visit (INDEPENDENT_AMBULATORY_CARE_PROVIDER_SITE_OTHER): Payer: Medicare HMO

## 2018-10-07 VITALS — BP 118/72 | HR 59 | Temp 98.0°F | Resp 16 | Ht 73.23 in | Wt 177.0 lb

## 2018-10-07 DIAGNOSIS — M545 Low back pain, unspecified: Secondary | ICD-10-CM

## 2018-10-07 DIAGNOSIS — Z130 Encounter for screening for diseases of the blood and blood-forming organs and certain disorders involving the immune mechanism: Secondary | ICD-10-CM | POA: Diagnosis not present

## 2018-10-07 DIAGNOSIS — M47816 Spondylosis without myelopathy or radiculopathy, lumbar region: Secondary | ICD-10-CM | POA: Diagnosis not present

## 2018-10-07 DIAGNOSIS — Z136 Encounter for screening for cardiovascular disorders: Secondary | ICD-10-CM

## 2018-10-07 DIAGNOSIS — M4802 Spinal stenosis, cervical region: Secondary | ICD-10-CM | POA: Diagnosis not present

## 2018-10-07 DIAGNOSIS — Z1383 Encounter for screening for respiratory disorder NEC: Secondary | ICD-10-CM

## 2018-10-07 DIAGNOSIS — Z8249 Family history of ischemic heart disease and other diseases of the circulatory system: Secondary | ICD-10-CM

## 2018-10-07 DIAGNOSIS — Z125 Encounter for screening for malignant neoplasm of prostate: Secondary | ICD-10-CM | POA: Diagnosis not present

## 2018-10-07 DIAGNOSIS — R202 Paresthesia of skin: Secondary | ICD-10-CM

## 2018-10-07 DIAGNOSIS — M25542 Pain in joints of left hand: Secondary | ICD-10-CM

## 2018-10-07 DIAGNOSIS — E7800 Pure hypercholesterolemia, unspecified: Secondary | ICD-10-CM | POA: Diagnosis not present

## 2018-10-07 DIAGNOSIS — I25810 Atherosclerosis of coronary artery bypass graft(s) without angina pectoris: Secondary | ICD-10-CM | POA: Diagnosis not present

## 2018-10-07 DIAGNOSIS — Z0000 Encounter for general adult medical examination without abnormal findings: Secondary | ICD-10-CM

## 2018-10-07 DIAGNOSIS — Z0001 Encounter for general adult medical examination with abnormal findings: Secondary | ICD-10-CM

## 2018-10-07 DIAGNOSIS — M542 Cervicalgia: Secondary | ICD-10-CM

## 2018-10-07 DIAGNOSIS — Z1329 Encounter for screening for other suspected endocrine disorder: Secondary | ICD-10-CM

## 2018-10-07 DIAGNOSIS — M25541 Pain in joints of right hand: Secondary | ICD-10-CM

## 2018-10-07 DIAGNOSIS — Z1389 Encounter for screening for other disorder: Secondary | ICD-10-CM

## 2018-10-07 DIAGNOSIS — G8929 Other chronic pain: Secondary | ICD-10-CM

## 2018-10-07 DIAGNOSIS — M47812 Spondylosis without myelopathy or radiculopathy, cervical region: Secondary | ICD-10-CM | POA: Diagnosis not present

## 2018-10-07 LAB — POCT URINALYSIS DIP (MANUAL ENTRY)
BILIRUBIN UA: NEGATIVE
BILIRUBIN UA: NEGATIVE mg/dL
Glucose, UA: NEGATIVE mg/dL
LEUKOCYTES UA: NEGATIVE
Nitrite, UA: NEGATIVE
PH UA: 6.5 (ref 5.0–8.0)
PROTEIN UA: NEGATIVE mg/dL
SPEC GRAV UA: 1.02 (ref 1.010–1.025)
Urobilinogen, UA: 0.2 E.U./dL

## 2018-10-07 MED ORDER — ZOSTER VAC RECOMB ADJUVANTED 50 MCG/0.5ML IM SUSR: 0.5000 mL | Freq: Once | INTRAMUSCULAR | 1 refills | Status: AC

## 2018-10-07 MED ORDER — ROSUVASTATIN CALCIUM 20 MG PO TABS: 20.0000 mg | ORAL_TABLET | Freq: Every day | ORAL | 3 refills | Status: DC

## 2018-10-07 NOTE — Patient Instructions (Addendum)
If you have lab work done today you will be contacted with your lab results within the next 2 weeks.  If you have not heard from [place] then please contact us. The fastest way to get your results is to register for My Chart.   IF you received an x-ray today, you will receive an invoice from [facility]. Please contact [facility] at [phone] with questions or concerns regarding your invoice.   IF you received labwork today, you will receive an invoice from [facility]. Please contact [facility] at [phone] with questions or concerns regarding your invoice.   Our billing staff will not be able to assist you with questions regarding bills from these companies.  You will be contacted with the lab results as soon as they are available. The fastest way to get your results is to activate your My Chart account. Instructions are located on the last page of this paperwork. If you have not heard from [place] regarding the results in 2 weeks, please contact this office.      LOW BACK EXERCISES  Stretching and range of motion exercises These exercises warm up your muscles and joints and improve the movement and flexibility of your hips and your back. These exercises may also help to relieve pain, numbness, and tingling. Exercise A: Single knee to chest  1. Lie on your back on a firm surface with both legs straight. 2. Bend one of your knees. Use your hands to move your knee up toward your chest until you feel a gentle stretch in your lower back and buttock. ? Hold your leg in this position by holding onto the front of your knee. ? Keep your other leg as straight as possible. 3. Hold for __________ seconds. 4. Slowly return to the starting position. 5. Repeat this exercise with your other leg. Repeat __________ times. Complete this exercise __________ times a day. Exercise B: Hamstring stretch, supine  1. Lie on your back. 2. Hold both ends of a belt or towel as you loop it over  the ball of one of your feet. The ball of your foot is on the walking surface, right under your toes. 3. Straighten your knee and slowly pull on the belt to raise your leg. ? Do not let your knee bend while you do this. ? Keep your other leg flat on the floor. ? Raise the leg until you feel a gentle stretch in the back of your knee or thigh. 4. Hold for __________ seconds. 5. If told by your health care provider, repeat this exercise with your other leg. Repeat __________ times. Complete this exercise __________ times a day. Strengthening exercises These exercises build strength and endurance in your back. Endurance is the ability to use your muscles for a long time, even after they get tired. Exercise C: Pelvic tilt 1. Lie on your back on a firm bed or the floor. Bend your knees and keep your feet flat. 2. Tense your abdominal muscles. Tip your pelvis up toward the ceiling and flatten your lower back into the floor. ? To help with this exercise, you may place a small towel under your lower back and try to push your back into the towel. 3. Hold for __________ seconds. 4. Let your muscles relax completely before you repeat this exercise. Repeat __________ times. Complete this exercise __________ times a day. Exercise D: Abdominal crunch  1. Lie on your back on a firm surface. Bend your knees and keep your feet  flat. Cross your arms over your chest. 2. Tuck your chin down toward your chest, without bending your neck. 3. Use your abdominal muscles to lift your upper body off of the ground, straight up into the air. ? Try to lift yourself until your shoulder blades are off the ground. You may need to work up to this. ? Keep your lower back on the ground while you crunch upward. ? Do not hold your breath. 4. Slowly lower yourself down. Keep your abdominal muscles tense until you are back to the starting position. Repeat __________ times. Complete this exercise __________ times a day. Exercise  E: Alternating arm and leg raises  1. Get on your hands and knees on a firm surface. If you are on a hard floor, you may want to use padding to cushion your knees, such as an exercise mat. 2. Line up your arms and legs. Your hands should be below your shoulders, and your knees should be below your hips. 3. Lift your left leg behind you. At the same time, raise your right arm and straighten it in front of you. ? Do not lift your leg higher than your hip. ? Do not lift your arm higher than your shoulder. ? Keep your abdominal and back muscles tight. ? Keep your hips facing the ground. ? Do not arch your back. ? Keep your balance carefully, and do not hold your breath. 4. Hold for __________ seconds. 5. Slowly return to the starting position and repeat with your right leg and your left arm. Repeat __________ times. Complete this exercise __________ times a day. Posture and body mechanics Body mechanics refers to the movements and positions of your body while you do your daily activities. Posture is part of body mechanics. Good posture and healthy body mechanics can help to relieve stress in your body's tissues and joints. Good posture means that your spine is in its natural S-curve position (your spine is neutral), your shoulders are pulled back slightly, and your head is not tipped forward. The following are general guidelines for applying improved posture and body mechanics to your everyday activities. Standing   When standing, keep your spine neutral and your feet about hip-width apart. Keep a slight bend in your knees. Your ears, shoulders, and hips should line up with each other.  When you do a task in which you stand in one place for a long time, place one foot up on a stable object that is 2-4 inches (5-10 cm) high, such as a footstool. This helps keep your spine neutral. Sitting   When sitting, keep your spine neutral and keep your feet flat on the floor. Use a footrest, if necessary,  and keep your thighs parallel to the floor. Avoid rounding your shoulders, and avoid tilting your head forward.  When working at a desk or a computer, keep your desk at a height where your hands are slightly lower than your elbows. Slide your chair under your desk so you are close enough to maintain good posture.  When working at a computer, place your monitor at a height where you are looking straight ahead and you do not have to tilt your head forward or downward to look at the screen. Resting  When lying down and resting, avoid positions that are most painful for you.  If you have pain with activities such as sitting, bending, stooping, or squatting (flexion-based activities), lie in a position in which your body does not bend very much. For example,  avoid curling up on your side with your arms and knees near your chest (fetal position).  If you have pain with activities such as standing for a long time or reaching with your arms (extension-based activities), lie with your spine in a neutral position and bend your knees slightly. Try the following positions: ? Lying on your side with a pillow between your knees. ? Lying on your back with a pillow under your knees.  Lifting   When lifting objects, keep your feet at least shoulder-width apart and tighten your abdominal muscles.  Bend your knees and hips and keep your spine neutral. It is important to lift using the strength of your legs, not your back. Do not lock your knees straight out.  Always ask for help to lift heavy or awkward objects. This information is not intended to replace advice given to you by your health care provider. Make sure you discuss any questions you have with your health care provider. Document Released: 10/09/2005 Document Revised: 06/15/2016 Document Reviewed: 07/20/2015 Elsevier Interactive Patient Education  [employer].
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
## 2018-10-08 LAB — COMPREHENSIVE METABOLIC PANEL
A/G RATIO: 1.5 (ref 1.2–2.2)
ALT: 27 IU/L (ref 0–44)
AST: 27 IU/L (ref 0–40)
Albumin: 4.8 g/dL (ref 3.6–4.8)
Alkaline Phosphatase: 76 IU/L (ref 39–117)
BUN/Creatinine Ratio: 19 (ref 10–24)
BUN: 17 mg/dL (ref 8–27)
Bilirubin Total: 0.5 mg/dL (ref 0.0–1.2)
CALCIUM: 9.7 mg/dL (ref 8.6–10.2)
CO2: 21 mmol/L (ref 20–29)
CREATININE: 0.9 mg/dL (ref 0.76–1.27)
Chloride: 100 mmol/L (ref 96–106)
GFR, EST AFRICAN AMERICAN: 102 mL/min/{1.73_m2} (ref >59)
GFR, EST NON AFRICAN AMERICAN: 88 mL/min/{1.73_m2} (ref >59)
Globulin, Total: 3.3 g/dL (ref 1.5–4.5)
Glucose: 101 mg/dL — ABNORMAL HIGH (ref 65–99)
Potassium: 4.7 mmol/L (ref 3.5–5.2)
Sodium: 139 mmol/L (ref 134–144)
TOTAL PROTEIN: 8.1 g/dL (ref 6.0–8.5)

## 2018-10-08 LAB — CBC
Hematocrit: 42 % (ref 37.5–51.0)
Hemoglobin: 14.8 g/dL (ref 13.0–17.7)
MCH: 33.5 pg — AB (ref 26.6–33.0)
MCHC: 35.2 g/dL (ref 31.5–35.7)
MCV: 95 fL (ref 79–97)
Platelets: 244 10*3/uL (ref 150–450)
RBC: 4.42 x10E6/uL (ref 4.14–5.80)
RDW: 12.4 % (ref 12.3–15.4)
WBC: 6 10*3/uL (ref 3.4–10.8)

## 2018-10-08 LAB — LIPID PANEL
CHOL/HDL RATIO: 3.2 ratio (ref 0.0–5.0)
Cholesterol, Total: 205 mg/dL — ABNORMAL HIGH (ref 100–199)
HDL: 64 mg/dL (ref >39)
LDL CALC: 83 mg/dL (ref 0–99)
TRIGLYCERIDES: 290 mg/dL — AB (ref 0–149)
VLDL CHOLESTEROL CAL: 58 mg/dL — AB (ref 5–40)

## 2018-10-08 LAB — TSH: TSH: 2.01 u[IU]/mL (ref 0.450–4.500)

## 2018-10-08 LAB — PSA, SERUM (SERIAL MONITOR): PROSTATE SPECIFIC AG, SERUM: 0.7 ng/mL (ref 0.0–4.0)

## 2018-11-06 ENCOUNTER — Encounter: Payer: Self-pay | Admitting: Family Medicine

## 2018-11-06 ENCOUNTER — Ambulatory Visit (INDEPENDENT_AMBULATORY_CARE_PROVIDER_SITE_OTHER): Payer: Medicare HMO | Admitting: Family Medicine

## 2018-11-06 VITALS — BP 170/78 | HR 109 | Temp 98.6°F | Resp 16 | Ht 72.5 in | Wt 182.2 lb

## 2018-11-06 DIAGNOSIS — L989 Disorder of the skin and subcutaneous tissue, unspecified: Secondary | ICD-10-CM | POA: Diagnosis not present

## 2018-11-06 DIAGNOSIS — R21 Rash and other nonspecific skin eruption: Secondary | ICD-10-CM

## 2018-11-06 MED ORDER — VALACYCLOVIR HCL 1 G PO TABS: 1000.0000 mg | ORAL_TABLET | Freq: Three times a day (TID) | ORAL | 0 refills | Status: DC

## 2018-11-06 NOTE — Progress Notes (Signed)
By signing my name below, I,[name], attest that this documentation has been prepared under the direction and in the presence of [name], MD. Electronically Signed: [name], Scribe 11/06/2018 at 3:07 PM.  Subjective:  Patient ID: Ernest [name], male    DOB: 1951-05-24  Age: 68 y.o. MRN: [id number]  CC:  Chief Complaint  Patient presents with  . Rash    on the side of left eye. per pt "patchy, itchy,lately itching has gotten alot worse, red, burns and dry x 3 mos    HPI Ernest [name] is a 68 y.o. male that presents today with complaints of a rash on left side of the face near his eye. He has a personal history of hyperlipidemia and arthralgia of both hands. He was last seen 10/07/2018 for his annual physical with Dr. [name] (her PCP).   He reports that the rash has persisted for about 3-4 month. It started as a small dry scaly patch. Within the past week it has expanded to cheekbone and gotten more itchy and red. He reports that his symptoms are effecting his eye  No blisters or bumps anywhere else. In the past month he noticed similar patches He has tried to self treat with hydrating lotion and CBD oil. He does not have any known history of skin cancers. He reports using regular soap to wash face daily   History Patient Active Problem List   Diagnosis Date Noted  . Arthralgia of both hands 09/28/2016  . Hyperlipemia 09/27/2016  . Health care maintenance 01/02/2013  . Family history of early CAD 01/02/2013   Past Medical History:  Diagnosis Date  . CAD (coronary artery disease)    Moderate Circumflex stenosis by cath in 2002  . Cataract 2019  . Cyst in hand    "fluid coming out of joints in fingers"; trying fish oil  . Hx of cardiac cath 2002   negative  . Hyperlipemia   . Kidney stones    Past Surgical History:  Procedure Laterality Date  . EYE SURGERY     both eyes   . kidney stone removal    . SPINE SURGERY  11/2007   ruptured disc  . VASECTOMY  1977  . WISDOM TOOTH EXTRACTION     No Known Allergies Prior to Admission medications   Medication Sig Start Date End Date Taking? Authorizing Provider  aspirin EC 81 MG tablet Take 1 tablet (81 mg total) by mouth daily. 12/24/17  Yes Burnell Blanks, MD  b complex vitamins tablet Take 1 tablet by mouth daily.   Yes [provider]  Multiple Vitamin (MULTIVITAMIN) tablet Take 1 tablet by mouth daily.   Yes [provider]  rosuvastatin (CRESTOR) 20 MG tablet Take 1 tablet (20 mg total) by mouth daily. 10/07/18 01/05/19 Yes Shawnee Knapp, MD   Family History  Problem Relation Age of Onset  . Heart disease Father        heart attack  . COPD Daughter   . Diabetes Maternal Grandmother   . Stroke Brother   . Cancer Brother   . Colon cancer Neg Hx    Social History   Socioeconomic History  . Marital status: Married    Spouse name: Not on file  . Number of children: Not on file  . Years of education: Not on file  . Highest education level: Not on file  Occupational History  . 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 Financial resource strain: Not on file  . Food insecurity:    Worry: Not on file    Inability: Not on file  . Transportation needs:    Medical: Not on file    Non-medical: Not on file  Tobacco Use  . Smoking status: Former Smoker    Packs/day: 1.50    Years: 15.00    Pack years: 22.50    Types: Cigarettes    Last attempt to quit: 1995    Years since quitting: 25.0  . Smokeless tobacco: Never Used  . Tobacco comment: quit around 1997  Substance and Sexual Activity  . Alcohol use: Yes    Alcohol/week: 7.0 standard drinks    Types: 7 Cans of beer per week    Comment: social  . Drug use: No  . Sexual activity: Yes    Partners: Female  Lifestyle  . Physical activity:    Days per week: Not on file    Minutes per session: Not on file  . Stress:  Not on file  Relationships  . Social connections:    Talks on phone: Not on file    Gets together: Not on file    Attends religious service: Not on file    Active member of club or organization: Not on file    Attends meetings of clubs or organizations: Not on file    Relationship status: Not on file  . Intimate partner violence:    Fear of current or ex partner: Not on file    Emotionally abused: Not on file    Physically abused: Not on file    Forced sexual activity: Not on file  Other Topics Concern  . Not on file  Social History Narrative   Married. Education:High School/Other.    Review of Systems See HPI  Objective:  BP (!) 170/78 (BP Location: Right Arm, Patient Position: Sitting, Cuff Size: Normal)   Pulse (!) 109   Temp 98.6 F (37 C) (Oral)   Resp 16   Ht 6' 0.5" (1.842 m)   Wt 182 lb 3.2 oz (82.6 kg)   SpO2 97%   BMI 24.37 kg/m   Physical Exam Constitutional:      General: He is not in acute distress.    Appearance: Normal appearance.  HENT:     Head: Normocephalic and atraumatic.  Eyes:     Comments: Negative Hutchinson's  Pulmonary:     Effort: Pulmonary effort is normal. No respiratory distress.  Skin:         Comments: No nasal rash Small scaling rash over dorsal right 4th and 5th mcp, and left 4th mcp.  Neurological:     Mental Status: He is alert and oriented to person, place, and time.  Psychiatric:        Mood and Affect: Mood normal.        Behavior: Behavior normal.    Assessment & Plan:   Ernest [name] is a 68 y.o. male Rash of face - Plan: valACYclovir (VALTREX) 1000 MG tablet, Ambulatory referral to Dermatology  Lesion of face - Plan: Ambulatory referral to Dermatology  -Start Valtrex to cover for possible shingles although unlikely at this time.  However I did also recommend optometry/ophthalmology evaluation in the next few days to evaluate for dendrites as episodic eye watering.  Polysporin to irritated/erythematous areas if  needed but avoid eyes.  Refer to dermatology for further evaluation.  Differential includes actinic keratosis.  RTC precautions  Meds ordered this encounter  Medications  . valACYclovir (VALTREX) 1000 MG tablet    Sig: Take 1 tablet (1,000 mg total) by mouth 3 (three) times daily.    Dispense:  21 tablet    Refill:  0   Patient Instructions   Okay to start Valtrex to cover for possible shingles, but I do not see any blistering rash at this time.  I will also refer you to dermatology to evaluate that area further.  For irritated/red area okay to apply Polysporin but keep that out of the eyes.  I would also recommend scheduling appointment with your optometrist/ophthalmologist in the next 2 days so they can look closer at the actual eye to make sure there are no lesions/rashes.  Thank you for coming in today.  Return to the clinic or go to the nearest emergency room if any of your symptoms worsen or new symptoms occur.   If you have lab work done today you will be contacted with your lab results within the next 2 weeks.  If you have not heard from [place] then please contact us. The fastest way to get your results is to register for My Chart.   IF you received an x-ray today, you will receive an invoice from [facility]. Please contact [facility] at [phone] with questions or concerns regarding your invoice.   IF you received labwork today, you will receive an invoice from [facility]. Please contact [facility] at [phone] with questions or concerns regarding your invoice.   Our billing staff will not be able to assist you with questions regarding bills from these companies.  You will be contacted with the lab results as soon as they are available. The fastest way to get your results is to activate your My Chart account. Instructions are located on the last page of this paperwork. If you have not heard from [place] regarding the results in 2 weeks, please contact this office.       I personally performed the services described in this documentation, which was scribed in my presence. The recorded information has been reviewed and considered for accuracy and completeness, addended by me as needed, and agree with information above.  Signed,   Merri Ray, MD [facility] at [place].  11/10/18 9:07 AM

## 2018-11-06 NOTE — Patient Instructions (Addendum)
Okay to start Valtrex to cover for possible shingles, but I do not see any blistering rash at this time.  I will also refer you to dermatology to evaluate that area further.  For irritated/red area okay to apply Polysporin but keep that out of the eyes.  I would also recommend scheduling appointment with your optometrist/ophthalmologist in the next 2 days so they can look closer at the actual eye to make sure there are no lesions/rashes.  Thank you for coming in today.  Return to the clinic or go to the nearest emergency room if any of your symptoms worsen or new symptoms occur.   If you have lab work done today you will be contacted with your lab results within the next 2 weeks.  If you have not heard from [place] then please contact us. The fastest way to get your results is to register for My Chart.   IF you received an x-ray today, you will receive an invoice from [facility]. Please contact [facility] at [phone] with questions or concerns regarding your invoice.   IF you received labwork today, you will receive an invoice from [facility]. Please contact [facility] at [phone] with questions or concerns regarding your invoice.   Our billing staff will not be able to assist you with questions regarding bills from these companies.  You will be contacted with the lab results as soon as they are available. The fastest way to get your results is to activate your My Chart account. Instructions are located on the last page of this paperwork. If you have not heard from [place] regarding the results in 2 weeks, please contact this office.

## 2018-11-27 ENCOUNTER — Ambulatory Visit: Payer: Medicare HMO | Admitting: Emergency Medicine

## 2018-12-02 ENCOUNTER — Ambulatory Visit: Payer: Medicare HMO | Admitting: Family Medicine

## 2018-12-04 DIAGNOSIS — R030 Elevated blood-pressure reading, without diagnosis of hypertension: Secondary | ICD-10-CM | POA: Diagnosis not present

## 2018-12-04 DIAGNOSIS — M5412 Radiculopathy, cervical region: Secondary | ICD-10-CM | POA: Diagnosis not present

## 2018-12-11 DIAGNOSIS — M5412 Radiculopathy, cervical region: Secondary | ICD-10-CM | POA: Diagnosis not present

## 2018-12-11 DIAGNOSIS — M542 Cervicalgia: Secondary | ICD-10-CM | POA: Diagnosis not present

## 2018-12-16 DIAGNOSIS — L218 Other seborrheic dermatitis: Secondary | ICD-10-CM | POA: Diagnosis not present

## 2018-12-16 DIAGNOSIS — L308 Other specified dermatitis: Secondary | ICD-10-CM | POA: Diagnosis not present

## 2018-12-16 DIAGNOSIS — L570 Actinic keratosis: Secondary | ICD-10-CM | POA: Diagnosis not present

## 2018-12-20 ENCOUNTER — Other Ambulatory Visit: Payer: Self-pay | Admitting: Neurological Surgery

## 2018-12-20 DIAGNOSIS — R030 Elevated blood-pressure reading, without diagnosis of hypertension: Secondary | ICD-10-CM | POA: Diagnosis not present

## 2018-12-20 DIAGNOSIS — M47812 Spondylosis without myelopathy or radiculopathy, cervical region: Secondary | ICD-10-CM | POA: Diagnosis not present

## 2018-12-30 ENCOUNTER — Ambulatory Visit
Admission: RE | Admit: 2018-12-30 | Discharge: 2018-12-30 | Disposition: A | Discharge status: Home / Self Care | Payer: Medicare HMO | Source: Ambulatory Visit | Attending: Neurological Surgery | Admitting: Neurological Surgery

## 2018-12-30 DIAGNOSIS — M47812 Spondylosis without myelopathy or radiculopathy, cervical region: Secondary | ICD-10-CM

## 2018-12-30 MED ORDER — DEXAMETHASONE SODIUM PHOSPHATE 4 MG/ML IJ SOLN
4.0000 mg | Freq: Once | INTRAMUSCULAR | Status: AC
  Administered 2018-12-30: 4 mg via INTRA_ARTICULAR

## 2018-12-30 NOTE — Discharge Instructions (Signed)

## 2019-02-18 ENCOUNTER — Ambulatory Visit: Payer: Medicare HMO | Admitting: Adult Health

## 2019-03-19 ENCOUNTER — Telehealth: Payer: Self-pay | Admitting: *Deleted

## 2019-03-19 NOTE — Telephone Encounter (Signed)
Tried to reach out to pt to schedule his 1 yr f/u from [facility] last ov. Called to offer visit 5/29 Friday with Kathyrn Drown, PA.

## 2019-07-28 ENCOUNTER — Encounter: Payer: Self-pay | Admitting: Cardiovascular Disease

## 2019-07-28 ENCOUNTER — Other Ambulatory Visit: Payer: Self-pay

## 2019-07-28 ENCOUNTER — Ambulatory Visit: Payer: Medicare HMO | Admitting: Cardiovascular Disease

## 2019-07-28 VITALS — BP 150/70 | HR 59 | Ht 72.05 in | Wt 179.8 lb

## 2019-07-28 DIAGNOSIS — E7800 Pure hypercholesterolemia, unspecified: Secondary | ICD-10-CM | POA: Diagnosis not present

## 2019-07-28 DIAGNOSIS — I2510 Atherosclerotic heart disease of native coronary artery without angina pectoris: Secondary | ICD-10-CM | POA: Diagnosis not present

## 2019-07-28 MED ORDER — ROSUVASTATIN CALCIUM 20 MG PO TABS: 20.0000 mg | ORAL_TABLET | Freq: Every day | ORAL | 3 refills | Status: DC

## 2019-07-28 NOTE — Progress Notes (Signed)
Chief Complaint  Patient presents with  . Follow-up    CAD   History of Present Illness: 68 yo male with history of CAD and hyperlipidemia who is here today for cardiac follow up. I saw him as a new consult March 2019 for evaluation of his CAD. He had a cardiac catheterization in 2002 with findings of a 20% ostial LAD stenosis, 50% ostial Circumflex stenosis, 50% distal Circumflex stenosis, 30% intermediate branch stenosis. He was not followed in cardiology following that cath. EKG December 2018 with sinus bradycardia, rate 54 bpm. He was started on Pravastatin December 2018 with LDL of 135 at that time. Echo March 2019 with LVEF=60-65%, no valve disease. Nuclear stress test March 2019 with no ischemia.   He is here today for follow up. The patient denies any chest pain, dyspnea, palpitations, lower extremity edema, orthopnea, PND, dizziness, near syncope or syncope.   Primary Care Physician: Shawnee Knapp, MD  Past Medical History:  Diagnosis Date  . CAD (coronary artery disease)    Moderate Circumflex stenosis by cath in 2002  . Cataract 2019  . Cyst in hand    "fluid coming out of joints in fingers"; trying fish oil  . Hx of cardiac cath 2002   negative  . Hyperlipemia   . Kidney stones     Past Surgical History:  Procedure Laterality Date  . EYE SURGERY     both eyes   . kidney stone removal    . SPINE SURGERY  11/2007   ruptured disc  . VASECTOMY  1977  . WISDOM TOOTH EXTRACTION      Current Outpatient Medications  Medication Sig Dispense Refill  . aspirin EC 81 MG tablet Take 1 tablet (81 mg total) by mouth daily. 90 tablet 3  . b complex vitamins tablet Take 1 tablet by mouth daily.    . Multiple Vitamin (MULTIVITAMIN) tablet Take 1 tablet by mouth daily.    . rosuvastatin (CRESTOR) 20 MG tablet Take 1 tablet (20 mg total) by mouth daily. 90 tablet 3   No current facility-administered medications for this visit.     No Known Allergies  Social History    Socioeconomic History  . Marital status: Married    Spouse name: Not on file  . Number of children: Not on file  . Years of education: Not on file  . Highest education level: Not on file  Occupational History  . 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 Financial resource strain: Not on file  . Food insecurity    Worry: Not on file    Inability: Not on file  . Transportation needs    Medical: Not on file    Non-medical: Not on file  Tobacco Use  . Smoking status: Former Smoker    Packs/day: 1.50    Years: 15.00    Pack years: 22.50    Types: Cigarettes    Quit date: 1995    Years since quitting: 25.7  . Smokeless tobacco: Never Used  . Tobacco comment: quit around 1997  Substance and Sexual Activity  . Alcohol use: Yes    Alcohol/week: 7.0 standard drinks    Types: 7 Cans of beer per week    Comment: social  . Drug use: No  . Sexual activity: Yes    Partners: Female  Lifestyle  . Physical activity    Days per week: Not on file  Minutes per session: Not on file  . Stress: Not on file  Relationships  . Social Herbalist on phone: Not on file    Gets together: Not on file    Attends religious service: Not on file    Active member of club or organization: Not on file    Attends meetings of clubs or organizations: Not on file    Relationship status: Not on file  . Intimate partner violence    Fear of current or ex partner: Not on file    Emotionally abused: Not on file    Physically abused: Not on file    Forced sexual activity: Not on file  Other Topics Concern  . Not on file  Social History Narrative   Married. Education:High School/Other.    Family History  Problem Relation Age of Onset  . Heart disease Father        heart attack  . COPD Daughter   . Diabetes Maternal Grandmother   . Stroke Brother   . Cancer Brother   . Colon cancer Neg Hx     Review of Systems:  As stated  in the HPI and otherwise negative.   BP (!) 150/70   Pulse (!) 59   Ht 6' 0.05" (1.83 m)   Wt 179 lb 12.8 oz (81.6 kg)   SpO2 98%   BMI 24.35 kg/m   Physical Examination: General: Well developed, well nourished, NAD  HEENT: OP clear, mucus membranes moist  SKIN: warm, dry. No rashes. Neuro: No focal deficits  Musculoskeletal: Muscle strength 5/5 all ext  Psychiatric: Mood and affect normal  Neck: No JVD, no carotid bruits, no thyromegaly, no lymphadenopathy.  Lungs:Clear bilaterally, no wheezes, rhonci, crackles Cardiovascular: Regular rate and rhythm. No murmurs, gallops or rubs. Abdomen:Soft. Bowel sounds present. Non-tender.  Extremities: No lower extremity edema. Pulses are 2 + in the bilateral DP/PT.  EKG:  EKG is ordered today. The EKG is personally reviewed by me and shows Sinus bradycardia, rate 59 bpm. Non-specific ST abnormality. Overall unchanged from prior EKG.    Echo March 2019:  - Left ventricle: The cavity size was normal. Wall thickness was   normal. Systolic function was normal. The estimated ejection   fraction was in the range of 60% to 65%. Wall motion was normal;   there were no regional wall motion abnormalities. - Aortic valve: Mildly calcified annulus.  Recent Labs: 10/07/2018: ALT 27; BUN 17; Creatinine, Ser 0.90; Hemoglobin 14.8; Platelets 244; Potassium 4.7; Sodium 139; TSH 2.010   Lipid Panel    Component Value Date/Time   CHOL 205 (H) 10/07/2018 0953   TRIG 290 (H) 10/07/2018 0953   HDL 64 10/07/2018 0953   CHOLHDL 3.2 10/07/2018 0953   CHOLHDL 3.5 03/23/2016 1108   VLDL 26 03/23/2016 1108   LDLCALC 83 10/07/2018 0953     Wt Readings from Last 3 Encounters:  07/28/19 179 lb 12.8 oz (81.6 kg)  11/06/18 182 lb 3.2 oz (82.6 kg)  10/07/18 177 lb (80.3 kg)     Other studies Reviewed: Additional studies/ records that were reviewed today include:  Review of the above records demonstrates:    Assessment and Plan:   1. CAD without  angina: He has mild to moderate CAD noted on cath in 2002. No ischemia on stress test in 2019. Continue ASA and statin.    2. Hyperlipidemia: LDL near goal of 70 in December 2019. Repeat lipids and LFTs. Continue statin.   Current  medicines are reviewed at length with the patient today.  The patient does not have concerns regarding medicines.  The following changes have been made:  no change  Labs/ tests ordered today include:   No orders of the defined types were placed in this encounter.    Disposition:   FU with me in one year.    Signed, Lauree Chandler, MD 07/28/2019 3:45 PM    [facility] [place], [place], [place]  [address] Phone: [phone]; Fax: [phone]

## 2019-07-28 NOTE — Patient Instructions (Signed)
Medication Instructions:  No changes If you need a refill on your cardiac medications before your next appointment, please call your pharmacy.   Lab work: none If you have labs (blood work) drawn today and your tests are completely normal, you will receive your results only by: . MyChart Message (if you have MyChart) OR . A paper copy in the mail If you have any lab test that is abnormal or we need to change your treatment, we will call you to review the results.  Testing/Procedures: none  Follow-Up: At [facility], you and your health needs are our priority.  As part of our continuing mission to provide you with exceptional heart care, we have created designated Provider Care Teams.  These Care Teams include your primary Cardiologist (physician) and Advanced Practice Providers (APPs -  Physician Assistants and Nurse Practitioners) who all work together to provide you with the care you need, when you need it. You will need a follow up appointment in 12 months.  Please call our office 2 months in advance to schedule this appointment.  You may see Christopher McAlhany, MD or one of the following Advanced Practice Providers on your designated Care Team:   Brittainy Simmons, PA-C Dayna Dunn, PA-C . Michele Lenze, PA-C  Any Other Special Instructions Will Be Listed Below (If Applicable).    

## 2019-11-17 ENCOUNTER — Ambulatory Visit: Payer: Medicare HMO | Attending: Internal Medicine

## 2019-11-17 DIAGNOSIS — Z23 Encounter for immunization: Secondary | ICD-10-CM | POA: Insufficient documentation

## 2019-11-17 NOTE — Progress Notes (Signed)
   Covid-19 Vaccination Clinic  Name:  Ernest [name]    MRN: [id number] DOB: 1951/03/01  11/17/2019  Mr. Ernest [name] was observed post Covid-19 immunization for 15 minutes without incidence. He was provided with Vaccine Information Sheet and instruction to access the V-Safe system.   Mr. Ernest [name] was instructed to call 911 with any severe reactions post vaccine: Marland Kitchen Difficulty breathing  . Swelling of your face and throat  . A fast heartbeat  . A bad rash all over your body  . Dizziness and weakness    Immunizations Administered    Name Date Dose VIS Date Route   Pfizer COVID-19 Vaccine 11/17/2019  3:07 PM 0.3 mL 10/03/2019 Intramuscular   Manufacturer: [place]   Lot: [id number]   Ken Caryl: [id number]

## 2019-12-08 ENCOUNTER — Ambulatory Visit: Payer: Medicare HMO | Attending: Internal Medicine

## 2019-12-08 DIAGNOSIS — Z23 Encounter for immunization: Secondary | ICD-10-CM | POA: Insufficient documentation

## 2019-12-08 NOTE — Progress Notes (Signed)
   Covid-19 Vaccination Clinic  Name:  Ernest [name]    MRN: [id number] DOB: 12-02-1950  12/08/2019  [name] was observed post Covid-19 immunization for 15 minutes without incidence. He was provided with Vaccine Information Sheet and instruction to access the V-Safe system.   [name] was instructed to call 911 with any severe reactions post vaccine: Marland Kitchen Difficulty breathing  . Swelling of your face and throat  . A fast heartbeat  . A bad rash all over your body  . Dizziness and weakness    Immunizations Administered    Name Date Dose VIS Date Route   Pfizer COVID-19 Vaccine 12/08/2019  2:13 PM 0.3 mL 10/03/2019 Intramuscular   Manufacturer: [place]   Lot: [id number]   [place]: [id number]

## 2020-07-09 ENCOUNTER — Other Ambulatory Visit: Payer: Self-pay | Admitting: Family Medicine

## 2020-07-09 ENCOUNTER — Ambulatory Visit
Admission: RE | Admit: 2020-07-09 | Discharge: 2020-07-09 | Disposition: A | Discharge status: Home / Self Care | Payer: Medicare HMO | Source: Ambulatory Visit | Attending: Family Medicine | Admitting: Family Medicine

## 2020-07-09 DIAGNOSIS — R52 Pain, unspecified: Secondary | ICD-10-CM

## 2020-09-20 ENCOUNTER — Ambulatory Visit (INDEPENDENT_AMBULATORY_CARE_PROVIDER_SITE_OTHER): Payer: Medicare HMO | Admitting: Cardiovascular Disease

## 2020-09-20 ENCOUNTER — Encounter: Payer: Self-pay | Admitting: Cardiovascular Disease

## 2020-09-20 ENCOUNTER — Other Ambulatory Visit: Payer: Self-pay

## 2020-09-20 VITALS — BP 138/72 | HR 57 | Ht 74.0 in | Wt 178.4 lb

## 2020-09-20 DIAGNOSIS — I2510 Atherosclerotic heart disease of native coronary artery without angina pectoris: Secondary | ICD-10-CM

## 2020-09-20 DIAGNOSIS — E7800 Pure hypercholesterolemia, unspecified: Secondary | ICD-10-CM | POA: Diagnosis not present

## 2020-09-20 LAB — LIPID PANEL
Chol/HDL Ratio: 3.4 ratio (ref 0.0–5.0)
Cholesterol, Total: 198 mg/dL (ref 100–199)
HDL: 59 mg/dL (ref >39)
LDL Chol Calc (NIH): 96 mg/dL (ref 0–99)
Triglycerides: 254 mg/dL — ABNORMAL HIGH (ref 0–149)
VLDL Cholesterol Cal: 43 mg/dL — ABNORMAL HIGH (ref 5–40)

## 2020-09-20 NOTE — Progress Notes (Signed)
Chief Complaint  Patient presents with  . Follow-up    CAD   History of Present Illness: 69 yo male with history of CAD and hyperlipidemia who is here today for cardiac follow up. I saw him as a new consult March 2019 for evaluation of his CAD. He had a cardiac catheterization in 2002 with findings of a 20% ostial LAD stenosis, 50% ostial Circumflex stenosis, 50% distal Circumflex stenosis, 30% intermediate branch stenosis. He was not followed in cardiology following that cath. EKG December 2018 with sinus bradycardia, rate 54 bpm. He was started on Pravastatin December 2018 with LDL of 135 at that time. Echo March 2019 with LVEF=60-65%, no valve disease. Nuclear stress test March 2019 with no ischemia.   He is here today for follow up. The patient denies any chest pain, dyspnea, palpitations, lower extremity edema, orthopnea, PND, dizziness, near syncope or syncope. He did have an episode six months ago where he fell down the stairs at night. He did not pass out.   Primary Care Physician: Shawnee Knapp, MD  Past Medical History:  Diagnosis Date  . CAD (coronary artery disease)    Moderate Circumflex stenosis by cath in 2002  . Cataract 2019  . Cyst in hand    "fluid coming out of joints in fingers"; trying fish oil  . Hx of cardiac cath 2002   negative  . Hyperlipemia   . Kidney stones     Past Surgical History:  Procedure Laterality Date  . EYE SURGERY     both eyes   . kidney stone removal    . SPINE SURGERY  11/2007   ruptured disc  . VASECTOMY  1977  . WISDOM TOOTH EXTRACTION      Current Outpatient Medications  Medication Sig Dispense Refill  . aspirin EC 81 MG tablet Take 1 tablet (81 mg total) by mouth daily. 90 tablet 3  . b complex vitamins tablet Take 1 tablet by mouth daily.    . Calcium Carbonate-Vit D-Min (CALTRATE 600+D PLUS PO) Take 800 Units by mouth daily.    Marland Kitchen lisinopril-hydrochlorothiazide (ZESTORETIC) 20-12.5 MG tablet Take 1 tablet by mouth daily.    .  Multiple Vitamin (MULTIVITAMIN) tablet Take 1 tablet by mouth daily.    . rosuvastatin (CRESTOR) 20 MG tablet Take 1 tablet (20 mg total) by mouth daily. 90 tablet 3   No current facility-administered medications for this visit.    No Known Allergies  Social History   Socioeconomic History  . Marital status: Married    Spouse name: Not on file  . Number of children: Not on file  . Years of education: Not on file  . Highest education level: Not on file  Occupational History  . 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 Smoking status: Former Smoker    Packs/day: 1.50    Years: 15.00    Pack years: 22.50    Types: Cigarettes    Quit date: 1995    Years since quitting: 26.9  . Smokeless tobacco: Never Used  . Tobacco comment: quit around 1997  Substance and Sexual Activity  . Alcohol use: Yes    Alcohol/week: 7.0 standard drinks    Types: 7 Cans of beer per week    Comment: social  . Drug use: No  . Sexual activity: Yes    Partners: Female  Other Topics Concern  . Not on file  Social History Narrative   Married. Education:High School/Other.   Social Determinants of Health   Financial Resource Strain:   . Difficulty of Paying Living Expenses: Not on file  Food Insecurity:   . Worried About Charity fundraiser in the Last Year: Not on file  . Ran Out of Food in the Last Year: Not on file  Transportation Needs:   . Lack of Transportation (Medical): Not on file  . Lack of Transportation (Non-Medical): Not on file  Physical Activity:   . Days of Exercise per Week: Not on file  . Minutes of Exercise per Session: Not on file  Stress:   . Feeling of Stress : Not on file  Social Connections:   . Frequency of Communication with Friends and Family: Not on file  . Frequency of Social Gatherings with Friends and Family: Not on file  . Attends Religious Services: Not on file  . Active Member of Clubs or  Organizations: Not on file  . Attends Archivist Meetings: Not on file  . Marital Status: Not on file  Intimate Partner Violence:   . Fear of Current or Ex-Partner: Not on file  . Emotionally Abused: Not on file  . Physically Abused: Not on file  . Sexually Abused: Not on file    Family History  Problem Relation Age of Onset  . Heart disease Father        heart attack  . COPD Daughter   . Diabetes Maternal Grandmother   . Stroke Brother   . Cancer Brother   . Colon cancer Neg Hx     Review of Systems:  As stated in the HPI and otherwise negative.   BP 138/72   Pulse (!) 57   Ht 6\' 2"  (1.88 m)   Wt 178 lb 6.4 oz (80.9 kg)   SpO2 99%   BMI 22.91 kg/m   Physical Examination:  General: Well developed, well nourished, NAD  HEENT: OP clear, mucus membranes moist  SKIN: warm, dry. No rashes. Neuro: No focal deficits  Musculoskeletal: Muscle strength 5/5 all ext  Psychiatric: Mood and affect normal  Neck: No JVD, no carotid bruits, no thyromegaly, no lymphadenopathy.  Lungs:Clear bilaterally, no wheezes, rhonci, crackles Cardiovascular: Regular rate and rhythm. No murmurs, gallops or rubs. Abdomen:Soft. Bowel sounds present. Non-tender.  Extremities: No lower extremity edema. Pulses are 2 + in the bilateral DP/PT.  EKG:  EKG is ordered today. The EKG is personally reviewed by me and shows sinus bradycardia, rate 57 bpm  Echo March 2019:  - Left ventricle: The cavity size was normal. Wall thickness was   normal. Systolic function was normal. The estimated ejection   fraction was in the range of 60% to 65%. Wall motion was normal;   there were no regional wall motion abnormalities. - Aortic valve: Mildly calcified annulus.  Recent Labs: No results found for requested labs within last 8760 hours.   Lipid Panel    Component Value Date/Time   CHOL 205 (H) 10/07/2018 0953   TRIG 290 (H) 10/07/2018 0953   HDL 64 10/07/2018 0953   CHOLHDL 3.2 10/07/2018 0953    CHOLHDL 3.5 03/23/2016 1108   VLDL 26 03/23/2016 1108   LDLCALC 83 10/07/2018 0953     Wt Readings from Last 3 Encounters:  09/20/20 178 lb 6.4 oz (80.9 kg)  07/28/19 179 lb 12.8 oz (81.6 kg)  11/06/18 182 lb 3.2 oz (82.6 kg)     Other studies Reviewed: Additional studies/  records that were reviewed today include:  Review of the above records demonstrates:    Assessment and Plan:   1. CAD without angina: He has mild to moderate CAD noted on cath in 2002. No ischemia on stress test in 2019. He has no chest pain. Will continue ASA and statin.     2. Hyperlipidemia: LDL near goal of 70 in December 2019. Will repeat lipids now. His LFTs were normal in primary care in July 2021. Continue statin.    Current medicines are reviewed at length with the patient today.  The patient does not have concerns regarding medicines.  The following changes have been made:  no change  Labs/ tests ordered today include:   Orders Placed This Encounter  Procedures  . Lipid panel  . EKG 12-Lead     Disposition:   FU with me in one year.    Signed, Lauree Chandler, MD 09/20/2020 9:37 AM    [facility] [place] [place], [place], [place]  [address] Phone: [phone]; Fax: [phone]

## 2020-09-20 NOTE — Patient Instructions (Signed)
Medication Instructions:  Your physician recommends that you continue on your current medications as directed. Please refer to the Current Medication list given to you today.  *If you need a refill on your cardiac medications before your next appointment, please call your pharmacy*   Lab Work: TODAY: Fasting lipids If you have labs (blood work) drawn today and your tests are completely normal, you will receive your results only by: Marland Kitchen MyChart Message (if you have MyChart) OR . A paper copy in the mail If you have any lab test that is abnormal or we need to change your treatment, we will call you to review the results.  Follow-Up: At [facility], you and your health needs are our priority.  As part of our continuing mission to provide you with exceptional heart care, we have created designated Provider Care Teams.  These Care Teams include your primary Cardiologist (physician) and Advanced Practice Providers (APPs -  Physician Assistants and Nurse Practitioners) who all work together to provide you with the care you need, when you need it.   Your next appointment:   1 year(s)  The format for your next appointment:   In Person  Provider:   You may see Lauree Chandler, MD or one of the following Advanced Practice Providers on your designated Care Team:    Melina Copa, PA-C  Ermalinda Barrios, PA-C

## 2020-09-21 ENCOUNTER — Telehealth: Payer: Self-pay | Admitting: *Deleted

## 2020-09-21 DIAGNOSIS — E7800 Pure hypercholesterolemia, unspecified: Secondary | ICD-10-CM

## 2020-09-21 MED ORDER — ROSUVASTATIN CALCIUM 40 MG PO TABS: 40.0000 mg | ORAL_TABLET | Freq: Every day | ORAL | 3 refills | Status: AC

## 2020-09-21 NOTE — Telephone Encounter (Signed)
-----   Message from Burnell Blanks, MD sent at 09/21/2020  8:36 AM EST ----- LDL is not at goal of 70. I would recommend going to Crestor 40 mg daily and repeating his Lipids and LFTs in 12 weeks. chris

## 2020-09-21 NOTE — Telephone Encounter (Signed)
Patient has been informed of results and recommendation to increase Crestor to 40 mg daliy.  He will return for labs 12/14/20.

## 2020-09-28 ENCOUNTER — Other Ambulatory Visit: Payer: Self-pay | Admitting: Cardiovascular Disease

## 2020-11-03 ENCOUNTER — Other Ambulatory Visit: Payer: Self-pay

## 2020-11-03 ENCOUNTER — Other Ambulatory Visit: Payer: Self-pay | Admitting: Family Medicine

## 2020-11-03 ENCOUNTER — Ambulatory Visit
Admission: RE | Admit: 2020-11-03 | Discharge: 2020-11-03 | Disposition: A | Discharge status: Home / Self Care | Payer: Medicare HMO | Source: Ambulatory Visit | Attending: Family Medicine | Admitting: Family Medicine

## 2020-11-03 DIAGNOSIS — R52 Pain, unspecified: Secondary | ICD-10-CM

## 2020-12-13 ENCOUNTER — Encounter: Payer: Self-pay | Admitting: Cardiovascular Disease

## 2020-12-14 ENCOUNTER — Other Ambulatory Visit: Payer: Medicare HMO | Admitting: *Deleted

## 2020-12-14 ENCOUNTER — Other Ambulatory Visit: Payer: Self-pay

## 2020-12-14 DIAGNOSIS — E7800 Pure hypercholesterolemia, unspecified: Secondary | ICD-10-CM

## 2020-12-14 LAB — HEPATIC FUNCTION PANEL
ALT: 34 IU/L (ref 0–44)
AST: 28 IU/L (ref 0–40)
Albumin: 4.8 g/dL (ref 3.8–4.8)
Alkaline Phosphatase: 66 IU/L (ref 44–121)
Bilirubin Total: 0.3 mg/dL (ref 0.0–1.2)
Bilirubin, Direct: 0.12 mg/dL (ref 0.00–0.40)
Total Protein: 7.2 g/dL (ref 6.0–8.5)

## 2020-12-14 LAB — LIPID PANEL
Chol/HDL Ratio: 2.7 ratio (ref 0.0–5.0)
Cholesterol, Total: 125 mg/dL (ref 100–199)
HDL: 46 mg/dL (ref >39)
LDL Chol Calc (NIH): 46 mg/dL (ref 0–99)
Triglycerides: 205 mg/dL — ABNORMAL HIGH (ref 0–149)
VLDL Cholesterol Cal: 33 mg/dL (ref 5–40)

## 2021-06-06 ENCOUNTER — Other Ambulatory Visit: Payer: Self-pay | Admitting: Family Medicine

## 2021-06-06 ENCOUNTER — Ambulatory Visit
Admission: RE | Admit: 2021-06-06 | Discharge: 2021-06-06 | Disposition: A | Discharge status: Home / Self Care | Payer: Medicare HMO | Source: Ambulatory Visit | Attending: Family Medicine | Admitting: Family Medicine

## 2021-06-06 ENCOUNTER — Other Ambulatory Visit: Payer: Self-pay

## 2021-06-06 DIAGNOSIS — M549 Dorsalgia, unspecified: Secondary | ICD-10-CM

## 2021-07-30 ENCOUNTER — Encounter: Payer: Self-pay | Admitting: Gastroenterology

## 2021-11-01 NOTE — Progress Notes (Signed)
Chief Complaint  Patient presents with   Follow-up    CAD   History of Present Illness: 71 yo male with history of CAD and hyperlipidemia who is here today for cardiac follow up. I saw him as a new consult March 2019 for evaluation of his CAD. He had a cardiac catheterization in 2002 with findings of a 20% ostial LAD stenosis, 50% ostial Circumflex stenosis, 50% distal Circumflex stenosis, 30% intermediate branch stenosis. He has been on a statin. Echo March 2019 with LVEF=60-65%, no valve disease. Nuclear stress test March 2019 with no ischemia.   He is here today for follow up. The patient denies any chest pain, dyspnea, palpitations, lower extremity edema, orthopnea, PND, dizziness, near syncope or syncope.    Primary Care Physician: Shawnee Knapp, MD  Past Medical History:  Diagnosis Date   CAD (coronary artery disease)    Moderate Circumflex stenosis by cath in 2002   Cataract 2019   Cyst in hand    "fluid coming out of joints in fingers"; trying fish oil   Hx of cardiac cath 2002   negative   Hyperlipemia    Kidney stones     Past Surgical History:  Procedure Laterality Date   EYE SURGERY     both eyes    kidney stone removal     SPINE SURGERY  11/2007   ruptured disc   VASECTOMY  1977   WISDOM TOOTH EXTRACTION      Current Outpatient Medications  Medication Sig Dispense Refill   aspirin EC 81 MG tablet Take 1 tablet (81 mg total) by mouth daily. 90 tablet 3   b complex vitamins tablet Take 1 tablet by mouth daily.     Calcium Carbonate-Vit D-Min (CALTRATE 600+D PLUS PO) Take 800 Units by mouth daily.     lisinopril-hydrochlorothiazide (ZESTORETIC) 20-12.5 MG tablet Take 1 tablet by mouth daily.     Multiple Vitamin (MULTIVITAMIN) tablet Take 1 tablet by mouth daily.     rosuvastatin (CRESTOR) 40 MG tablet Take 1 tablet (40 mg total) by mouth daily. 90 tablet 3   No current facility-administered medications for this visit.    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 Education:High School/Other.   Social Determinants of Health   Financial Resource Strain: Not on file  Food Insecurity: Not on file  Transportation Needs: Not on file  Physical Activity: Not on file  Stress: Not on file  Social Connections: Not on file  Intimate Partner Violence: Not on file    Family History  Problem Relation Age of Onset   Heart disease Father        heart attack   COPD Daughter    Diabetes Maternal Grandmother    Stroke Brother    Cancer Brother    Colon cancer Neg Hx     Review of Systems:  As stated in the HPI and otherwise negative.   BP 124/74    Pulse (!) 54    Ht 6\' 2"  (1.88 m)    Wt 164 lb (74.4 kg)    SpO2 99%    BMI 21.06 kg/m   Physical Examination:  General: Well developed, well nourished, NAD  HEENT: OP clear, mucus membranes moist  SKIN: warm, dry. No rashes. Neuro: No focal deficits  Musculoskeletal: Muscle strength 5/5 all ext  Psychiatric: Mood and affect normal  Neck: No JVD, no carotid bruits, no thyromegaly, no lymphadenopathy.  Lungs:Clear bilaterally, no wheezes, rhonci,  crackles Cardiovascular: Regular rate and rhythm. No murmurs, gallops or rubs. Abdomen:Soft. Bowel sounds present. Non-tender.  Extremities: No lower extremity edema. Pulses are 2 + in the bilateral DP/PT.  EKG:  EKG is  ordered today. The EKG is personally reviewed by me and shows sinus bradycardia, rate 54 bpm  Echo March 2019:  - Left ventricle: The cavity size was normal. Wall thickness was   normal. Systolic function was normal. The estimated ejection   fraction was in the range of 60% to 65%. Wall motion was normal;   there were no regional wall motion abnormalities. - Aortic valve: Mildly calcified annulus.  Recent Labs: 12/14/2020: ALT 34   Lipid Panel    Component Value Date/Time   CHOL 125 12/14/2020 0832   TRIG 205 (H) 12/14/2020 0832   HDL 46 12/14/2020 0832   CHOLHDL 2.7 12/14/2020 0832   CHOLHDL 3.5 03/23/2016 1108   VLDL 26 03/23/2016 1108   LDLCALC 46 12/14/2020 0832     Wt Readings from Last 3 Encounters:  11/02/21 164 lb (74.4 kg)  09/20/20 178 lb 6.4 oz (80.9 kg)  07/28/19 179 lb 12.8 oz (81.6 kg)     Other studies Reviewed: Additional studies/ records that were reviewed today include:  Review of the above records demonstrates:    Assessment and Plan:   1. CAD without angina: He has mild to moderate CAD noted on cath in 2002. No ischemia on stress test in 2019. No chest pain. Continue ASA and statin.      2. Hyperlipidemia: LDL 61 in August 2022. Continue statin.     Current medicines are reviewed at length with the patient today.  The patient does not have concerns regarding medicines.  The following changes have been made:  no change  Labs/ tests ordered today include:   Orders Placed This Encounter  Procedures   EKG 12-Lead     Disposition:   F/U with me in one year.    Signed, Lauree Chandler, MD 11/02/2021 10:16 AM    [facility] [place], [place], [place]  [address] Phone: [phone]; Fax:  [phone]

## 2021-11-02 ENCOUNTER — Other Ambulatory Visit: Payer: Self-pay

## 2021-11-02 ENCOUNTER — Encounter: Payer: Self-pay | Admitting: Cardiovascular Disease

## 2021-11-02 ENCOUNTER — Ambulatory Visit (INDEPENDENT_AMBULATORY_CARE_PROVIDER_SITE_OTHER): Payer: Medicare HMO | Admitting: Cardiovascular Disease

## 2021-11-02 VITALS — BP 124/74 | HR 54 | Ht 74.0 in | Wt 164.0 lb

## 2021-11-02 DIAGNOSIS — I2510 Atherosclerotic heart disease of native coronary artery without angina pectoris: Secondary | ICD-10-CM | POA: Diagnosis not present

## 2021-11-02 DIAGNOSIS — E7800 Pure hypercholesterolemia, unspecified: Secondary | ICD-10-CM | POA: Diagnosis not present

## 2021-11-02 NOTE — Patient Instructions (Signed)
Medication Instructions:  Your physician recommends that you continue on your current medications as directed. Please refer to the Current Medication list given to you today.  *If you need a refill on your cardiac medications before your next appointment, please call your pharmacy*   Lab Work: None today If you have labs (blood work) drawn today and your tests are completely normal, you will receive your results only by: [place] (if you have MyChart) OR A paper copy in the mail If you have any lab test that is abnormal or we need to change your treatment, we will call you to review the results.   Testing/Procedures: None today   Follow-Up: At [facility], you and your health needs are our priority.  As part of our continuing mission to provide you with exceptional heart care, we have created designated Provider Care Teams.  These Care Teams include your primary Cardiologist (physician) and Advanced Practice Providers (APPs -  Physician Assistants and Nurse Practitioners) who all work together to provide you with the care you need, when you need it.  We recommend signing up for the patient portal called "MyChart".  Sign up information is provided on this After Visit Summary.  MyChart is used to connect with patients for Virtual Visits (Telemedicine).  Patients are able to view lab/test results, encounter notes, upcoming appointments, etc.  Non-urgent messages can be sent to your provider as well.   To learn more about what you can do with MyChart, go to [email or web address].    Your next appointment:   1 year(s)  The format for your next appointment:   In Person  Provider:   Lauree Chandler, MD {

## 2022-01-30 ENCOUNTER — Ambulatory Visit (AMBULATORY_SURGERY_CENTER): Payer: Medicare HMO | Admitting: *Deleted

## 2022-01-30 VITALS — Ht 73.0 in | Wt 175.0 lb

## 2022-01-30 DIAGNOSIS — Z86010 Personal history of colonic polyps: Secondary | ICD-10-CM

## 2022-01-30 MED ORDER — NA SULFATE-K SULFATE-MG SULF 17.5-3.13-1.6 GM/177ML PO SOLN: 2.0000 | Freq: Once | ORAL | 0 refills | Status: AC

## 2022-01-30 NOTE — Progress Notes (Signed)

## 2022-02-16 ENCOUNTER — Encounter: Payer: Self-pay | Admitting: Gastroenterology

## 2022-02-27 ENCOUNTER — Encounter: Payer: Medicare HMO | Admitting: Gastroenterology

## 2022-06-02 ENCOUNTER — Ambulatory Visit
Admission: RE | Admit: 2022-06-02 | Discharge: 2022-06-02 | Disposition: A | Discharge status: Home / Self Care | Payer: Medicare HMO | Source: Ambulatory Visit | Attending: Registered Nurse | Admitting: Registered Nurse

## 2022-06-02 ENCOUNTER — Other Ambulatory Visit: Payer: Self-pay | Admitting: Registered Nurse

## 2022-06-02 DIAGNOSIS — M25442 Effusion, left hand: Secondary | ICD-10-CM

## 2022-06-02 DIAGNOSIS — M25441 Effusion, right hand: Secondary | ICD-10-CM

## 2022-11-20 ENCOUNTER — Encounter: Payer: Self-pay | Admitting: Cardiovascular Disease

## 2022-11-20 ENCOUNTER — Ambulatory Visit: Payer: Medicare HMO | Attending: Cardiovascular Disease | Admitting: Cardiovascular Disease

## 2022-11-20 VITALS — BP 110/58 | HR 66 | Ht 73.0 in | Wt 158.0 lb

## 2022-11-20 DIAGNOSIS — I2510 Atherosclerotic heart disease of native coronary artery without angina pectoris: Secondary | ICD-10-CM

## 2022-11-20 DIAGNOSIS — E7800 Pure hypercholesterolemia, unspecified: Secondary | ICD-10-CM | POA: Diagnosis not present

## 2022-11-20 NOTE — Progress Notes (Signed)
Chief Complaint  Patient presents with   Follow-up    CAD   History of Present Illness: 72 yo male with history of CAD and hyperlipidemia who is here today for cardiac follow up. He was seen in our office in March 2019 for evaluation of his CAD. He had a cardiac catheterization in 2002 with findings of a 20% ostial LAD stenosis, 50% ostial Circumflex stenosis, 50% distal Circumflex stenosis, 30% intermediate branch stenosis. He has been on a statin and ASA. Echo March 2019 with LVEF=60-65%, no valve disease. Nuclear stress test March 2019 with no ischemia.   He is here today for follow up. The patient denies any chest pain, dyspnea, palpitations, lower extremity edema, orthopnea, PND, dizziness, near syncope or syncope.    Primary Care Physician: Arthur Holms, NP  Past Medical History:  Diagnosis Date   CAD (coronary artery disease)    Moderate Circumflex stenosis by cath in 2002   Cataract 2019   Cyst in hand    "fluid coming out of joints in fingers"; trying fish oil   Hx of cardiac cath 2002   negative   Hyperlipemia    Hypertension    Kidney stones     Past Surgical History:  Procedure Laterality Date   EYE SURGERY     both eyes    kidney stone removal     SPINE SURGERY  11/2007   ruptured disc   VASECTOMY  1977   WISDOM TOOTH EXTRACTION      Current Outpatient Medications  Medication Sig Dispense Refill   aspirin EC 81 MG tablet Take 1 tablet (81 mg total) by mouth daily. 90 tablet 3   b complex vitamins tablet Take 1 tablet by mouth daily.     Calcium Carbonate-Vit D-Min (CALTRATE 600+D PLUS PO) Take 800 Units by mouth daily.     lisinopril-hydrochlorothiazide (ZESTORETIC) 20-12.5 MG tablet Take 1 tablet by mouth daily.     Multiple Vitamin (MULTIVITAMIN) tablet Take 1 tablet by mouth daily.     rosuvastatin (CRESTOR) 40 MG tablet Take 1 tablet (40 mg total) by mouth daily. 90 tablet 3   No current facility-administered medications for this visit.    No Known  Allergies  Social History   Socioeconomic History   Marital status: Unknown    Spouse name: Not on file   Number of children: Not on file   Years of education: Not on file   Highest education level: Not on file  Occupational History   Occupation: Magazine features editor: [employer]    Comment: NOW RETIRED  Tobacco Use   Smoking status: Former    Packs/day: 1.50    Years: 15.00    Total pack years: 22.50    Types: Cigarettes    Quit date: 1995    Years since quitting: 29.0   Smokeless tobacco: Never   Tobacco comments:    quit around 1997  Vaping Use   Vaping Use: Never used  Substance and Sexual Activity   Alcohol use: Yes    Alcohol/week: 7.0 standard drinks of alcohol    Types: 7 Cans of beer per week    Comment: social   Drug use: No   Sexual activity: Yes    Partners: Female  Other Topics Concern   Not on file  Social History Narrative   ** Merged History Encounter **       Married. Education:High School/Other.   Social Determinants of Health   Financial  Resource Strain: Not on file  Food Insecurity: Not on file  Transportation Needs: Not on file  Physical Activity: Not on file  Stress: Not on file  Social Connections: Not on file  Intimate Partner Violence: Not on file    Family History  Problem Relation Age of Onset   Heart disease Father        heart attack   Stroke Brother    Cancer Brother    Diabetes Maternal Grandmother    COPD Daughter    Colon cancer Neg Hx    Colon polyps Neg Hx    Esophageal cancer Neg Hx    Stomach cancer Neg Hx    Rectal cancer Neg Hx     Review of Systems:  As stated in the HPI and otherwise negative.   BP (!) 110/58   Pulse 66   Ht '6\' 1"'$  (1.854 m)   Wt 71.7 kg   SpO2 99%   BMI 20.85 kg/m   Physical Examination: General: Well developed, well nourished, NAD  HEENT: OP clear, mucus membranes moist  SKIN: warm, dry. No rashes. Neuro: No focal deficits  Musculoskeletal: Muscle strength 5/5  all ext  Psychiatric: Mood and affect normal  Neck: No JVD, no carotid bruits, no thyromegaly, no lymphadenopathy.  Lungs:Clear bilaterally, no wheezes, rhonci, crackles Cardiovascular: Regular rate and rhythm. No murmurs, gallops or rubs. Abdomen:Soft. Bowel sounds present. Non-tender.  Extremities: No lower extremity edema. Pulses are 2 + in the bilateral DP/PT.  EKG:  EKG is ordered today. The EKG is personally reviewed by me and shows NSR  Echo March 2019:  - Left ventricle: The cavity size was normal. Wall thickness was   normal. Systolic function was normal. The estimated ejection   fraction was in the range of 60% to 65%. Wall motion was normal;   there were no regional wall motion abnormalities. - Aortic valve: Mildly calcified annulus.  Recent Labs: No results found for requested labs within last 365 days.   Lipid Panel    Component Value Date/Time   CHOL 125 12/14/2020 0832   TRIG 205 (H) 12/14/2020 0832   HDL 46 12/14/2020 0832   CHOLHDL 2.7 12/14/2020 0832   CHOLHDL 3.5 03/23/2016 1108   VLDL 26 03/23/2016 1108   LDLCALC 46 12/14/2020 0832     Wt Readings from Last 3 Encounters:  11/20/22 71.7 kg  01/30/22 79.4 kg  11/02/21 74.4 kg    Assessment and Plan:   1. CAD without angina: He has mild to moderate CAD noted on cath in 2002. No ischemia on stress test in 2019. No chest pain suggestive of angina. Continue ASA and statin.   2. Hyperlipidemia: Lipids followed in primary care. Continue statin.   Labs/ tests ordered today include:   Orders Placed This Encounter  Procedures   EKG 12-Lead   Disposition:   F/U with me in one year.    Signed, Lauree Chandler, MD 11/20/2022 2:58 PM    [address], [place], [place]  [address] Phone: [phone]; Fax: [phone]

## 2022-11-20 NOTE — Patient Instructions (Signed)
Medication Instructions:  No changes *If you need a refill on your cardiac medications before your next appointment, please call your pharmacy*   Lab Work: none If you have labs (blood work) drawn today and your tests are completely normal, you will receive your results only by: [place] (if you have MyChart) OR A paper copy in the mail If you have any lab test that is abnormal or we need to change your treatment, we will call you to review the results.   Testing/Procedures: none   Follow-Up: At [facility], you and your health needs are our priority.  As part of our continuing mission to provide you with exceptional heart care, we have created designated Provider Care Teams.  These Care Teams include your primary Cardiologist (physician) and Advanced Practice Providers (APPs -  Physician Assistants and Nurse Practitioners) who all work together to provide you with the care you need, when you need it.   Your next appointment:   12 month(s)  Provider:   Lauree Chandler, MD
